# Patient Record
Sex: Male | Born: 1990 | Race: White | Hispanic: No | Marital: Single | Smoking: Current every day smoker
Health system: Southern US, Community
[De-identification: ages and names within clinical notes are randomized; demographics above are authoritative.]

---
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
## 2009-05-10 ENCOUNTER — Emergency Department (HOSPITAL_COMMUNITY): Admission: EM | Admit: 2009-05-10 | Discharge: 2009-05-10 | Payer: Self-pay | Admitting: Emergency Medicine

## 2009-05-19 ENCOUNTER — Emergency Department (HOSPITAL_COMMUNITY): Admission: EM | Admit: 2009-05-19 | Discharge: 2009-05-19 | Payer: Self-pay | Admitting: Emergency Medicine

## 2009-05-24 ENCOUNTER — Emergency Department (HOSPITAL_COMMUNITY): Admission: EM | Admit: 2009-05-24 | Discharge: 2009-05-24 | Payer: Self-pay | Admitting: Emergency Medicine

## 2009-07-04 ENCOUNTER — Emergency Department (HOSPITAL_COMMUNITY): Admission: EM | Admit: 2009-07-04 | Discharge: 2009-07-04 | Payer: Self-pay | Admitting: Emergency Medicine

## 2011-01-22 LAB — GC/CHLAMYDIA PROBE AMP, GENITAL
Chlamydia, DNA Probe: NEGATIVE
GC Probe Amp, Genital: NEGATIVE

## 2011-01-22 LAB — RPR: RPR Ser Ql: NONREACTIVE

## 2011-07-09 ENCOUNTER — Emergency Department (HOSPITAL_COMMUNITY)
Admission: EM | Admit: 2011-07-09 | Discharge: 2011-07-09 | Disposition: A | Discharge status: Home / Self Care | Payer: Self-pay | Attending: Emergency Medicine | Admitting: Emergency Medicine

## 2011-07-09 ENCOUNTER — Encounter: Payer: Self-pay | Admitting: *Deleted

## 2011-07-09 DIAGNOSIS — F17200 Nicotine dependence, unspecified, uncomplicated: Secondary | ICD-10-CM | POA: Insufficient documentation

## 2011-07-09 DIAGNOSIS — Y92009 Unspecified place in unspecified non-institutional (private) residence as the place of occurrence of the external cause: Secondary | ICD-10-CM | POA: Insufficient documentation

## 2011-07-09 DIAGNOSIS — S5010XA Contusion of unspecified forearm, initial encounter: Secondary | ICD-10-CM | POA: Insufficient documentation

## 2011-07-09 DIAGNOSIS — K029 Dental caries, unspecified: Secondary | ICD-10-CM | POA: Insufficient documentation

## 2011-07-09 DIAGNOSIS — T148XXA Other injury of unspecified body region, initial encounter: Secondary | ICD-10-CM

## 2011-07-09 DIAGNOSIS — X58XXXA Exposure to other specified factors, initial encounter: Secondary | ICD-10-CM | POA: Insufficient documentation

## 2011-07-09 NOTE — ED Provider Notes (Signed)
History     CSN: [id number] Arrival date & time: 07/09/2011  9:20 PM  Chief Complaint  Patient presents with  . Insect Bite    HPI  (Consider location/radiation/quality/duration/timing/severity/associated sxs/prior treatment)  HPI Comments: Pt states that while mowing the lawn he felt a sting, then noted a bruise to the left forearm. No bleeding. Not painful. Not affected by movement. Does not affect other parts of the left upper ext. No fever or chills. He has not taken anything for the affected area.  The history is provided by the patient.    History reviewed. No pertinent past medical history.  History reviewed. No pertinent past surgical history.  History reviewed. No pertinent family history.  History  Substance Use Topics  . Smoking status: Current Some Day Smoker  . Smokeless tobacco: Not on file  . Alcohol Use: Yes      Review of Systems  Review of Systems  Constitutional: Negative for activity change.       All ROS Neg except as noted in HPI  HENT: Negative for nosebleeds and neck pain.   Eyes: Negative for photophobia and discharge.  Respiratory: Negative for cough, shortness of breath and wheezing.   Cardiovascular: Negative for chest pain and palpitations.  Gastrointestinal: Negative for abdominal pain and blood in stool.  Genitourinary: Negative for dysuria, frequency and hematuria.  Musculoskeletal: Negative for back pain and arthralgias.  Skin: Negative.   Neurological: Negative for dizziness, seizures and speech difficulty.  Psychiatric/Behavioral: Negative for hallucinations and confusion.    Allergies  Review of patient's allergies indicates no known allergies.  Home Medications  No current outpatient prescriptions on file.  Physical Exam    BP 118/66  Pulse 91  Temp 97.7 F (36.5 C)  Resp 20  Ht 5\' 10"  (1.778 m)  Wt 240 lb (108.863 kg)  BMI 34.44 kg/m2  SpO2 98%  Physical Exam  Nursing note and vitals reviewed. Constitutional:  He is oriented to person, place, and time. He appears well-developed and well-nourished.  Non-toxic appearance.  HENT:  Head: Normocephalic.  Right Ear: Tympanic membrane and external ear normal.  Left Ear: Tympanic membrane and external ear normal.       Dental caries  Eyes: EOM and lids are normal. Pupils are equal, round, and reactive to light.  Neck: Normal range of motion. Neck supple. Carotid bruit is not present.  Cardiovascular: Normal rate, regular rhythm, normal heart sounds, intact distal pulses and normal pulses.   Pulmonary/Chest: Breath sounds normal. No respiratory distress.  Abdominal: Soft. Bowel sounds are normal. There is no tenderness. There is no guarding.  Musculoskeletal: Normal range of motion.       Bruise to the ulnar surface of the right elbow. Not hot. No red streaks. FROM of the right shoulder, elbow, and wrist.  Lymphadenopathy:       Head (right side): No submandibular adenopathy present.       Head (left side): No submandibular adenopathy present.    He has no cervical adenopathy.  Neurological: He is alert and oriented to person, place, and time. He has normal strength. No cranial nerve deficit or sensory deficit.  Skin: Skin is warm and dry.  Psychiatric: He has a normal mood and affect. His speech is normal.    ED Course: advised pt to return if any changes or problem.  Procedures (including critical care time)  Labs Reviewed - No data to display No results found.   1. Bruise to the forearm(right)  MDM  I have reviewed nursing notes, vital signs, and all appropriate lab and imaging results for this patient.        Kathie Dike, [place] 07/09/11 2229

## 2011-07-09 NOTE — ED Notes (Signed)
Pt has a bruise on left forearm.

## 2011-07-11 NOTE — ED Provider Notes (Signed)
Medical screening examination/treatment/procedure(s) were performed by non-physician practitioner and as supervising physician I was immediately available for consultation/collaboration.   Benny Lennert, MD 07/11/11 1436

## 2012-07-29 ENCOUNTER — Emergency Department: Payer: Self-pay | Admitting: Emergency Medicine

## 2012-09-17 ENCOUNTER — Emergency Department (HOSPITAL_COMMUNITY)
Admission: EM | Admit: 2012-09-17 | Discharge: 2012-09-17 | Disposition: A | Discharge status: Home / Self Care | Payer: Self-pay | Attending: Emergency Medicine | Admitting: Emergency Medicine

## 2012-09-17 ENCOUNTER — Encounter (HOSPITAL_COMMUNITY): Payer: Self-pay | Admitting: *Deleted

## 2012-09-17 DIAGNOSIS — F17200 Nicotine dependence, unspecified, uncomplicated: Secondary | ICD-10-CM | POA: Insufficient documentation

## 2012-09-17 DIAGNOSIS — N4889 Other specified disorders of penis: Secondary | ICD-10-CM | POA: Insufficient documentation

## 2012-09-17 MED ORDER — IBUPROFEN 600 MG PO TABS: 600.0000 mg | ORAL_TABLET | Freq: Four times a day (QID) | ORAL | Status: DC | PRN

## 2012-09-17 MED ORDER — IBUPROFEN 800 MG PO TABS
800.0000 mg | ORAL_TABLET | Freq: Once | ORAL | Status: AC
  Administered 2012-09-17: 800 mg via ORAL
  Filled 2012-09-17: qty 1
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
## 2012-09-21 NOTE — ED Provider Notes (Signed)
Medical screening examination/treatment/procedure(s) were performed by non-physician practitioner and as supervising physician I was immediately available for consultation/collaboration.  Urian Martenson, MD 09/21/12 0614 

## 2013-03-21 ENCOUNTER — Emergency Department (HOSPITAL_COMMUNITY)
Admission: EM | Admit: 2013-03-21 | Discharge: 2013-03-21 | Disposition: A | Discharge status: Home / Self Care | Payer: Self-pay | Attending: Emergency Medicine | Admitting: Emergency Medicine

## 2013-03-21 ENCOUNTER — Encounter (HOSPITAL_COMMUNITY): Payer: Self-pay | Admitting: Emergency Medicine

## 2013-03-21 DIAGNOSIS — L0390 Cellulitis, unspecified: Secondary | ICD-10-CM | POA: Insufficient documentation

## 2013-03-21 DIAGNOSIS — L299 Pruritus, unspecified: Secondary | ICD-10-CM | POA: Insufficient documentation

## 2013-03-21 DIAGNOSIS — F17200 Nicotine dependence, unspecified, uncomplicated: Secondary | ICD-10-CM | POA: Insufficient documentation

## 2013-03-21 DIAGNOSIS — L259 Unspecified contact dermatitis, unspecified cause: Secondary | ICD-10-CM

## 2013-03-21 DIAGNOSIS — L0291 Cutaneous abscess, unspecified: Secondary | ICD-10-CM | POA: Insufficient documentation

## 2013-03-21 MED ORDER — DIPHENHYDRAMINE HCL 25 MG PO CAPS
25.0000 mg | ORAL_CAPSULE | Freq: Once | ORAL | Status: AC
  Administered 2013-03-21: 25 mg via ORAL
  Filled 2013-03-21: qty 1

## 2013-03-21 MED ORDER — PREDNISONE 50 MG PO TABS
60.0000 mg | ORAL_TABLET | Freq: Once | ORAL | Status: AC
  Administered 2013-03-21: 60 mg via ORAL
  Filled 2013-03-21: qty 1

## 2013-03-21 MED ORDER — PREDNISONE 10 MG PO TABS: ORAL_TABLET | ORAL | Status: DC

## 2013-03-21 NOTE — ED Notes (Signed)
Pt alert & oriented x4, stable gait. Patient given discharge instructions, paperwork & prescription(s). Patient  instructed to stop at the registration desk to finish any additional paperwork. Patient verbalized understanding. Pt left department w/ no further questions. 

## 2013-03-21 NOTE — ED Notes (Signed)
Pt has rash to the upper chest for over a week. Hurts when he touches it. Denies any new clothes or detergents. Pt also states some pain from hemorrhoids and wanting to know what he can do.,

## 2013-03-21 NOTE — ED Provider Notes (Signed)
History     CSN: [id number]  Arrival date & time 03/21/13  2057   First MD Initiated Contact with Patient 03/21/13 2135      Chief Complaint  Patient presents with  . Rash    (Consider location/radiation/quality/duration/timing/severity/associated sxs/prior treatment) HPI Comments: Thomas Potts is a 22 y.o. male who presents to the Emergency Department complaining of rash to chest, upper arms and upper back for one week.  States the rash itches and seems to be worse with sweating.  He has tried OTC creams w/o relief.  He denies fever, joint pain, tick bite or swelling.    The history is provided by the patient.    History reviewed. No pertinent past medical history.  History reviewed. No pertinent past surgical history.  History reviewed. No pertinent family history.  History  Substance Use Topics  . Smoking status: Current Some Day Smoker  . Smokeless tobacco: Not on file  . Alcohol Use: Yes      Review of Systems  Constitutional: Negative for fever, chills, activity change and appetite change.  HENT: Negative for sore throat, facial swelling, trouble swallowing, neck pain and neck stiffness.   Respiratory: Negative for chest tightness, shortness of breath and wheezing.   Skin: Positive for rash. Negative for wound.  Neurological: Negative for dizziness, weakness, numbness and headaches.  All other systems reviewed and are negative.    Allergies  Other  Home Medications   Current Outpatient Rx  Name  Route  Sig  Dispense  Refill  . ibuprofen (ADVIL,MOTRIN) 600 MG tablet   Oral   Take 1 tablet (600 mg total) by mouth every 6 (six) hours as needed for pain.   30 tablet   0     BP 133/69  Pulse 82  Temp(Src) 97.7 F (36.5 C) (Oral)  Resp 18  Ht 5\' 10"  (1.778 m)  Wt 227 lb (102.967 kg)  BMI 32.57 kg/m2  SpO2 100%  Physical Exam  Nursing note and vitals reviewed. Constitutional: He is oriented to person, place, and time. He appears  well-developed and well-nourished. No distress.  HENT:  Head: Normocephalic and atraumatic.  Mouth/Throat: Uvula is midline, oropharynx is clear and moist and mucous membranes are normal. No oral lesions.  Neck: Normal range of motion. Neck supple.  Cardiovascular: Normal rate, regular rhythm, normal heart sounds and intact distal pulses.   No murmur heard. Pulmonary/Chest: Effort normal and breath sounds normal. No respiratory distress.  Musculoskeletal: He exhibits no edema and no tenderness.  Lymphadenopathy:    He has no cervical adenopathy.  Neurological: He is alert and oriented to person, place, and time. He exhibits normal muscle tone. Coordination normal.  Skin: Rash noted. There is erythema.  Erythematous maculopapular rash to the upper trunk and upper bilateral arms. No edema or pustules    ED Course  Procedures (including critical care time)  Labs Reviewed - No data to display No results found.      MDM    Patient is alert, VSS.  Non-toxic appearing. Maculopapular rash to the trunk that appears c/w contact dermatitis.  Hands and feet are spared.     Will treat with steroid taper, pt agrees to return here if sx's worsen.    The patient appears reasonably screened and/or stabilized for discharge and I doubt any other medical condition or other [facility] requiring further screening, evaluation, or treatment in the ED at this time prior to discharge.      Shuree Brossart L. Khya Halls, PA-C  03/23/13 2035 

## 2013-03-21 NOTE — ED Notes (Signed)
Patient complaining of rash to chest and upper body that started approximately a week ago.

## 2013-03-25 NOTE — ED Provider Notes (Signed)
Medical screening examination/treatment/procedure(s) were performed by non-physician practitioner and as supervising physician I was immediately available for consultation/collaboration.   Adra Shepler M Jarely Juncaj, DO 03/25/13 1225 

## 2013-04-30 ENCOUNTER — Emergency Department (HOSPITAL_COMMUNITY)
Admission: EM | Admit: 2013-04-30 | Discharge: 2013-04-30 | Discharge status: Against Medical Advice | Payer: Self-pay | Attending: Emergency Medicine | Admitting: Emergency Medicine

## 2013-04-30 ENCOUNTER — Encounter (HOSPITAL_COMMUNITY): Payer: Self-pay

## 2013-04-30 DIAGNOSIS — Y939 Activity, unspecified: Secondary | ICD-10-CM | POA: Insufficient documentation

## 2013-04-30 DIAGNOSIS — W57XXXA Bitten or stung by nonvenomous insect and other nonvenomous arthropods, initial encounter: Secondary | ICD-10-CM | POA: Insufficient documentation

## 2013-04-30 DIAGNOSIS — S90569A Insect bite (nonvenomous), unspecified ankle, initial encounter: Secondary | ICD-10-CM | POA: Insufficient documentation

## 2013-04-30 DIAGNOSIS — Y929 Unspecified place or not applicable: Secondary | ICD-10-CM | POA: Insufficient documentation

## 2013-04-30 NOTE — ED Notes (Signed)
After triage, pt asked how long before he would be seen. When informed pt that he may have to wait  A bit, pt ripped off his armband and states "i'm leaving" and left e.d.

## 2013-04-30 NOTE — ED Notes (Signed)
Pt has ?? Insect bite to right upper thigh, states he does not know what bit him, is burning pain

## 2013-05-25 ENCOUNTER — Encounter (HOSPITAL_COMMUNITY): Payer: Self-pay

## 2013-05-25 ENCOUNTER — Emergency Department (HOSPITAL_COMMUNITY)
Admission: EM | Admit: 2013-05-25 | Discharge: 2013-05-25 | Disposition: A | Discharge status: Home / Self Care | Payer: Self-pay | Attending: Emergency Medicine | Admitting: Emergency Medicine

## 2013-05-25 DIAGNOSIS — J029 Acute pharyngitis, unspecified: Secondary | ICD-10-CM | POA: Insufficient documentation

## 2013-05-25 DIAGNOSIS — F17200 Nicotine dependence, unspecified, uncomplicated: Secondary | ICD-10-CM | POA: Insufficient documentation

## 2013-05-25 LAB — RAPID STREP SCREEN (MED CTR MEBANE ONLY): Streptococcus, Group A Screen (Direct): NEGATIVE

## 2013-05-25 MED ORDER — NAPROXEN 500 MG PO TABS: 500.0000 mg | ORAL_TABLET | Freq: Two times a day (BID) | ORAL | Status: DC

## 2013-05-25 NOTE — ED Notes (Signed)
Sore throat for 3 days per pt.

## 2013-05-25 NOTE — ED Provider Notes (Signed)
CSN: [id number]     Arrival date & time 05/25/13  0135 History     First MD Initiated Contact with Patient 05/25/13 [phone]     Chief Complaint  Patient presents with  . Sore Throat   (Consider location/radiation/quality/duration/timing/severity/associated sxs/prior Treatment) HPI Comments: 22 year old male with no significant past medical history who presents with a sore throat which he states has been present for approximately 3 days and started after he ate some peach cobbler. He states that it did not taste quite right, he thinks it may have been too old and noticed that several hours after taking this food he developed a sore throat. This is intermittent, seems to be worse when he swallows, is not constant and is not associated with fevers chills nausea vomiting coughing shortness of breath swelling of the neck stiffness of the neck back pain or headache. He has no pain in his face, no toothache, no nasal congestion.  Patient is a 22 y.o. male presenting with pharyngitis. The history is provided by the patient and a relative.  Sore Throat Pertinent negatives include no abdominal pain.    History reviewed. No pertinent past medical history. History reviewed. No pertinent past surgical history. No family history on file. History  Substance Use Topics  . Smoking status: Current Some Day Smoker  . Smokeless tobacco: Not on file  . Alcohol Use: Yes    Review of Systems  Constitutional: Negative for fever, chills and fatigue.  HENT: Negative for neck pain and neck stiffness.   Gastrointestinal: Negative for abdominal pain.    Allergies  Other  Home Medications   Current Outpatient Rx  Name  Route  Sig  Dispense  Refill  . ibuprofen (ADVIL,MOTRIN) 600 MG tablet   Oral   Take 1 tablet (600 mg total) by mouth every 6 (six) hours as needed for pain.   30 tablet   0   . naproxen (NAPROSYN) 500 MG tablet   Oral   Take 1 tablet (500 mg total) by mouth 2 (two) times daily with a  meal.   30 tablet   0   . predniSONE (DELTASONE) 10 MG tablet      Take 6 tablets day one, 5 tablets day two, 4 tablets day three, 3 tablets day four, 2 tablets day five, then 1 tablet day six   21 tablet   0    BP 119/69  Pulse 70  Temp(Src) 98.4 F (36.9 C) (Oral)  Resp 18  Ht 5\' 10"  (1.778 m)  Wt 223 lb (101.152 kg)  BMI 32 kg/m2  SpO2 100% Physical Exam  Nursing note and vitals reviewed. Constitutional: He appears well-developed and well-nourished. No distress.  HENT:  Head: Normocephalic and atraumatic.  Mouth/Throat: Oropharynx is clear and moist. No oropharyngeal exudate.  No erythema, exudate, asymmetry, hypertrophy. Mucous membranes are moist, phonation is normal, dentition is normal, tympanic membranes are normal, nasal passages are clear  Eyes: Conjunctivae and EOM are normal. Pupils are equal, round, and reactive to light. Right eye exhibits no discharge. Left eye exhibits no discharge. No scleral icterus.  Neck: Normal range of motion. Neck supple. No JVD present. No thyromegaly present.  Cardiovascular: Normal rate, regular rhythm, normal heart sounds and intact distal pulses.  Exam reveals no gallop and no friction rub.   No murmur heard. Pulmonary/Chest: Effort normal and breath sounds normal. No respiratory distress. He has no wheezes. He has no rales.  Abdominal: Soft. Bowel sounds are normal. He exhibits no distension  and no mass. There is no tenderness.  No hepatosplenomegaly  Musculoskeletal: Normal range of motion. He exhibits no edema and no tenderness.  Lymphadenopathy:    He has no cervical adenopathy.  Neurological: He is alert. Coordination normal.  Skin: Skin is warm and dry. No rash noted. No erythema.  Psychiatric: He has a normal mood and affect. His behavior is normal.    ED Course   Procedures (including critical care time)  Labs Reviewed  RAPID STREP SCREEN  CULTURE, GROUP A STREP   No results found. 1. Pharyngitis     MDM  The  patient has a normal exam, he has no fever, no tachycardia and no evidence of strep throat on his exam. Rapid strep test performed, ordered by nursing, is negative  The patient may have incurred a small abrasion to his pharynx however at this time he has no evidence for a retropharyngeal abscess, peritonsillar abscess or significant infection of his throat. He is very well appearing, has normal phonation, very supple neck with no decreased range of motion and appears very stable for discharge. We'll place him on anti-inflammatories and have followup with family doctor.  Vida Roller, MD 05/25/13 [phone]

## 2013-05-27 LAB — CULTURE, GROUP A STREP

## 2013-05-28 ENCOUNTER — Emergency Department (HOSPITAL_COMMUNITY)
Admission: EM | Admit: 2013-05-28 | Discharge: 2013-05-28 | Disposition: A | Discharge status: Home / Self Care | Payer: Self-pay | Attending: Emergency Medicine | Admitting: Emergency Medicine

## 2013-05-28 ENCOUNTER — Emergency Department (HOSPITAL_COMMUNITY): Payer: Self-pay

## 2013-05-28 ENCOUNTER — Encounter (HOSPITAL_COMMUNITY): Payer: Self-pay | Admitting: *Deleted

## 2013-05-28 DIAGNOSIS — R197 Diarrhea, unspecified: Secondary | ICD-10-CM | POA: Insufficient documentation

## 2013-05-28 DIAGNOSIS — F411 Generalized anxiety disorder: Secondary | ICD-10-CM | POA: Insufficient documentation

## 2013-05-28 DIAGNOSIS — J029 Acute pharyngitis, unspecified: Secondary | ICD-10-CM | POA: Insufficient documentation

## 2013-05-28 DIAGNOSIS — E069 Thyroiditis, unspecified: Secondary | ICD-10-CM | POA: Insufficient documentation

## 2013-05-28 DIAGNOSIS — F17200 Nicotine dependence, unspecified, uncomplicated: Secondary | ICD-10-CM | POA: Insufficient documentation

## 2013-05-28 DIAGNOSIS — Z880 Allergy status to penicillin: Secondary | ICD-10-CM | POA: Insufficient documentation

## 2013-05-28 DIAGNOSIS — R1310 Dysphagia, unspecified: Secondary | ICD-10-CM | POA: Insufficient documentation

## 2013-05-28 NOTE — ED Provider Notes (Signed)
CSN: [id number]     Arrival date & time 05/28/13  1952 History  This chart was scribed for Ward Givens, MD by Ardelia Mems, ED Scribe. This patient was seen in room APA03/[facility]03 and the patient's care was started at 8:21 PM.    Chief Complaint  Patient presents with  . Sore Throat  . Cough    The history is provided by the patient. No language interpreter was used.    HPI Comments: Thomas Potts is a 22 y.o. Male without significant PMH who presents to the Emergency Department complaining of a sore throat, with pain around the larynx, over the past week. He states that he also has pain with swallowing and sometimes with breathing. He states that he has had a productive cough of white sputum for two days in the past week  but only in the mornings. He states that he had 1 episode of diarrhea 2 days ago a week ago, but he has not had diarrhea since. He was seen in the ED 3 days ago, and had a negative strep test.  He was given a prescription that he did not fill. He denies fever, chills, congestion, rhinorrhea, nausea, vomiting, rash or any other symptoms. Pt is a current every day smoker of about 0.5 packs/day and an occasional alcohol user. He states that he has been working at a [employer] for about 4 months. He states they come a blow dust off the equipment and sprinkle with water but he doesn't feel worse at work.   PCP- has a family doctor but cannot recall his name   History reviewed. No pertinent past medical history.  History reviewed. No pertinent past surgical history.  History reviewed. No pertinent family history.  History  Substance Use Topics  . Smoking status: Current Some Day Smoker    Types: Cigarettes  . Smokeless tobacco: Not on file  . Alcohol Use: Yes     Comment: occ .use  employed  Review of Systems  Constitutional: Negative for fever and chills.  HENT: Positive for sore throat (pain around the larynx) and trouble swallowing (pain with swallowing).  Negative for congestion and rhinorrhea.   Respiratory: Positive for cough.   Gastrointestinal: Positive for diarrhea (subsided). Negative for nausea and vomiting.  Skin: Negative for rash.  All other systems reviewed and are negative.   Allergies  Penicillins  Home Medications   none Triage Vitals: BP 113/59  Pulse 70  Temp(Src) 98.2 F (36.8 C) (Oral)  Resp 18  Ht 5\' 10"  (1.778 m)  Wt 223 lb (101.152 kg)  BMI 32 kg/m2  SpO2 100%  Vital signs normal    Physical Exam  Nursing note and vitals reviewed. Constitutional: He is oriented to person, place, and time. He appears well-developed and well-nourished.  Non-toxic appearance. He does not appear ill. No distress.  HENT:  Head: Normocephalic and atraumatic.  Right Ear: External ear normal.  Left Ear: External ear normal.  Nose: Nose normal. No mucosal edema or rhinorrhea.  Mouth/Throat: Oropharynx is clear and moist and mucous membranes are normal. No dental abscesses or edematous.  Thyroid does not feel enlarged, but he feels tenderness around his thyroid when he swallow.  Eyes: Conjunctivae and EOM are normal. Pupils are equal, round, and reactive to light.  Neck: Normal range of motion and full passive range of motion without pain. Neck supple.  Cardiovascular: Normal rate, regular rhythm and normal heart sounds.  Exam reveals no gallop and no friction rub.  No murmur heard. Pulmonary/Chest: Effort normal and breath sounds normal. No respiratory distress. He has no wheezes. He has no rhonchi. He has no rales. He exhibits no tenderness and no crepitus.  Abdominal: Soft. Normal appearance and bowel sounds are normal. He exhibits no distension. There is no tenderness. There is no rebound and no guarding.  Musculoskeletal: Normal range of motion. He exhibits no edema and no tenderness.  Moves all extremities well.   Neurological: He is alert and oriented to person, place, and time. He has normal strength. No cranial nerve  deficit.  Skin: Skin is warm, dry and intact. No rash noted. No erythema. No pallor.  Psychiatric: His speech is normal and behavior is normal. His mood appears anxious.    ED Course   Procedures (including critical care time)  DIAGNOSTIC STUDIES: Oxygen Saturation is 100% on RA, normal by my interpretation.    COORDINATION OF CARE: 9:03 PM- Pt advised of normal radiology findings.  Results for orders placed during the hospital encounter of 05/25/13  RAPID STREP SCREEN      Result Value Range   Streptococcus, Group A Screen (Direct) NEGATIVE  NEGATIVE  CULTURE, GROUP A STREP      Result Value Range   Specimen Description THROAT     Special Requests NONE     Culture       Value: No Beta Hemolytic Streptococci Isolated     Performed at [facility]   Report Status 05/27/2013 FINAL    Thyroid tests pending  Dg Chest 2 View  05/28/2013   *RADIOLOGY REPORT*  Clinical Data: Shortness of breath  CHEST - 2 VIEW  Comparison: 05/10/2009  Findings: Cardiomediastinal silhouette is within normal limits. The lungs are clear. No pleural effusion.  No pneumothorax.  No acute osseous abnormality.  IMPRESSION: Normal chest.   Original Report Authenticated By: Christiana Pellant, M.D.   1. Thyroiditis     Plan discharge   Devoria Albe, MD, FACEP   MDM    I personally performed the services described in this documentation, which was scribed in my presence. The recorded information has been reviewed and considered.  Devoria Albe, MD, Armando Gang     Ward Givens, MD 05/28/13 2136

## 2013-05-28 NOTE — ED Notes (Signed)
C/o sore throat and cough, productive at times, denies fever and N/V

## 2013-05-28 NOTE — ED Notes (Signed)
MD at bedside. 

## 2013-05-29 LAB — T4, FREE: Free T4: 1.05 ng/dL (ref 0.80–1.80)

## 2013-05-29 LAB — TSH: TSH: 3.184 u[IU]/mL (ref 0.350–4.500)

## 2013-06-24 ENCOUNTER — Emergency Department (HOSPITAL_COMMUNITY)
Admission: EM | Admit: 2013-06-24 | Discharge: 2013-06-25 | Disposition: A | Discharge status: Home / Self Care | Payer: Self-pay | Attending: Emergency Medicine | Admitting: Emergency Medicine

## 2013-06-24 ENCOUNTER — Encounter (HOSPITAL_COMMUNITY): Payer: Self-pay | Admitting: *Deleted

## 2013-06-24 DIAGNOSIS — F17200 Nicotine dependence, unspecified, uncomplicated: Secondary | ICD-10-CM | POA: Insufficient documentation

## 2013-06-24 DIAGNOSIS — J029 Acute pharyngitis, unspecified: Secondary | ICD-10-CM | POA: Insufficient documentation

## 2013-06-24 DIAGNOSIS — R509 Fever, unspecified: Secondary | ICD-10-CM | POA: Insufficient documentation

## 2013-06-24 DIAGNOSIS — Z880 Allergy status to penicillin: Secondary | ICD-10-CM | POA: Insufficient documentation

## 2013-06-24 DIAGNOSIS — R059 Cough, unspecified: Secondary | ICD-10-CM | POA: Insufficient documentation

## 2013-06-24 DIAGNOSIS — R630 Anorexia: Secondary | ICD-10-CM | POA: Insufficient documentation

## 2013-06-24 DIAGNOSIS — B349 Viral infection, unspecified: Secondary | ICD-10-CM

## 2013-06-24 DIAGNOSIS — R05 Cough: Secondary | ICD-10-CM | POA: Insufficient documentation

## 2013-06-24 DIAGNOSIS — B9789 Other viral agents as the cause of diseases classified elsewhere: Secondary | ICD-10-CM | POA: Insufficient documentation

## 2013-06-24 LAB — RAPID STREP SCREEN (MED CTR MEBANE ONLY): Streptococcus, Group A Screen (Direct): NEGATIVE

## 2013-06-24 NOTE — ED Notes (Signed)
Pt states he woke today & was so weak he was unable to go to work. Pt denies any fever, has not eaten much today.

## 2013-06-24 NOTE — ED Notes (Signed)
Pt c/o throat "irritation". Denies any pain with swallowing.

## 2013-06-24 NOTE — ED Notes (Signed)
Pt reports he was "feeling weak today. I couldn't go into work. They told me I had to have a doctor's note, and I just wanted to make sure it wasn't contagious to spread around work." Denies N/V, abdominal pain, cough, sore throat.

## 2013-06-24 NOTE — ED Provider Notes (Signed)
Scribed for Donnetta Hutching, MD, the patient was seen in room APA05/APA05. This chart was scribed by Lewanda Rife, ED scribe. Patient's care was started at 2316  CSN: [id number]     Arrival date & time 06/24/13  2242 History   First MD Initiated Contact with Patient 06/24/13 2305     Chief Complaint  Patient presents with  . Fatigue   (Consider location/radiation/quality/duration/timing/severity/associated sxs/prior Treatment) The history is provided by the patient.   HPI Comments: Thomas Potts is a 22 y.o. male who presents to the Emergency Department complaining of constant mild fatigue onset this morning. Reports associated mild sore throat and decreased appetite. Denies associated cough, and fever. 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 Denies any aggravating or alleviating factors. Denies taking any medications PTA to relieve symptoms. Reports he smokes 1 pack every 3 days and drinks alcohol. Reports 1 possible sick contact.   History reviewed. No pertinent past medical history. History reviewed. No pertinent past surgical history. No family history on file. History  Substance Use Topics  . Smoking status: Current Some Day Smoker    Types: Cigarettes  . Smokeless tobacco: Not on file  . Alcohol Use: Yes     Comment: occ .use    Review of Systems  Constitutional: Negative for fever.  HENT: Positive for sore throat.   Respiratory: Negative for cough.   Psychiatric/Behavioral: Negative for confusion.   A complete 10 system review of systems was obtained and all systems are negative except as noted in the HPI and PMH.    Allergies  Penicillins  Home Medications   Current Outpatient Rx  Name  Route  Sig  Dispense  Refill  . ibuprofen (ADVIL,MOTRIN) 600 MG tablet   Oral   Take 1 tablet (600 mg total) by mouth every 6 (six) hours as needed for pain.   30 tablet   0   . naproxen (NAPROSYN) 500 MG tablet   Oral   Take 1 tablet (500 mg total) by mouth 2 (two) times daily  with a meal.   30 tablet   0   . predniSONE (DELTASONE) 10 MG tablet      Take 6 tablets day one, 5 tablets day two, 4 tablets day three, 3 tablets day four, 2 tablets day five, then 1 tablet day six   21 tablet   0    BP 123/58  Pulse 69  Temp(Src) 97.3 F (36.3 C) (Oral)  Resp 20  Ht 5\' 11"  (1.803 m)  Wt 221 lb (100.245 kg)  BMI 30.84 kg/m2  SpO2 100% Physical Exam  Nursing note and vitals reviewed. Constitutional: He is oriented to person, place, and time. He appears well-developed and well-nourished. No distress.  HENT:  Head: Normocephalic and atraumatic.  Mouth/Throat: Uvula is midline and mucous membranes are normal. Posterior oropharyngeal erythema (mild) present. No oropharyngeal exudate or posterior oropharyngeal edema.  Eyes: Conjunctivae and EOM are normal.  Neck: Neck supple. No tracheal deviation present.  Cardiovascular: Normal rate and regular rhythm.   Pulmonary/Chest: Effort normal and breath sounds normal. No respiratory distress.  Musculoskeletal: Normal range of motion.  Neurological: He is alert and oriented to person, place, and time.  Skin: Skin is warm and dry.  Psychiatric: He has a normal mood and affect. His behavior is normal.    ED Course  Procedures (including critical care time) Medications - No data to display  Labs Review Labs Reviewed  RAPID STREP SCREEN  CULTURE, GROUP A STREP   Imaging  Review No results found.  MDM  No diagnosis found. Strep test negative. Patient is nontoxic. Suspect viral syndrome I personally performed the services described in this documentation, which was scribed in my presence. The recorded information has been reviewed and is accurate.    Donnetta Hutching, MD 06/25/13 [phone]

## 2013-06-27 LAB — CULTURE, GROUP A STREP

## 2014-01-02 ENCOUNTER — Emergency Department (HOSPITAL_COMMUNITY)
Admission: EM | Admit: 2014-01-02 | Discharge: 2014-01-02 | Disposition: A | Discharge status: Home / Self Care | Payer: Self-pay | Attending: Emergency Medicine | Admitting: Emergency Medicine

## 2014-01-02 ENCOUNTER — Encounter (HOSPITAL_COMMUNITY): Payer: Self-pay | Admitting: Emergency Medicine

## 2014-01-02 DIAGNOSIS — R0602 Shortness of breath: Secondary | ICD-10-CM | POA: Insufficient documentation

## 2014-01-02 DIAGNOSIS — R059 Cough, unspecified: Secondary | ICD-10-CM | POA: Insufficient documentation

## 2014-01-02 DIAGNOSIS — F17200 Nicotine dependence, unspecified, uncomplicated: Secondary | ICD-10-CM | POA: Insufficient documentation

## 2014-01-02 DIAGNOSIS — Z880 Allergy status to penicillin: Secondary | ICD-10-CM | POA: Insufficient documentation

## 2014-01-02 DIAGNOSIS — R05 Cough: Secondary | ICD-10-CM

## 2014-01-02 LAB — CBC WITH DIFFERENTIAL/PLATELET
Basophils Absolute: 0 10*3/uL (ref 0.0–0.1)
Basophils Relative: 0 % (ref 0–1)
Eosinophils Absolute: 0.1 10*3/uL (ref 0.0–0.7)
Eosinophils Relative: 1 % (ref 0–5)
HCT: 45.1 % (ref 39.0–52.0)
Hemoglobin: 15.6 g/dL (ref 13.0–17.0)
Lymphocytes Relative: 18 % (ref 12–46)
Lymphs Abs: 1.9 10*3/uL (ref 0.7–4.0)
MCH: 31.4 pg (ref 26.0–34.0)
MCHC: 34.6 g/dL (ref 30.0–36.0)
MCV: 90.7 fL (ref 78.0–100.0)
Monocytes Absolute: 1.1 10*3/uL — ABNORMAL HIGH (ref 0.1–1.0)
Monocytes Relative: 11 % (ref 3–12)
Neutro Abs: 7.5 10*3/uL (ref 1.7–7.7)
Neutrophils Relative %: 71 % (ref 43–77)
Platelets: 166 10*3/uL (ref 150–400)
RBC: 4.97 MIL/uL (ref 4.22–5.81)
RDW: 11.9 % (ref 11.5–15.5)
WBC: 10.6 10*3/uL — ABNORMAL HIGH (ref 4.0–10.5)

## 2014-01-02 LAB — D-DIMER, QUANTITATIVE: D-Dimer, Quant: <0.27 ug/mL-FEU (ref 0.00–0.48)

## 2014-01-02 MED ORDER — GUAIFENESIN-CODEINE 100-10 MG/5ML PO SYRP
10.0000 mL | ORAL_SOLUTION | Freq: Three times a day (TID) | ORAL | Status: DC | PRN
Start: 2014-01-02 — End: 2015-12-17

## 2014-01-02 MED ORDER — ALBUTEROL SULFATE HFA 108 (90 BASE) MCG/ACT IN AERS
2.0000 | INHALATION_SPRAY | Freq: Once | RESPIRATORY_TRACT | Status: AC
  Administered 2014-01-02: 2 via RESPIRATORY_TRACT
  Filled 2014-01-02: qty 6.7

## 2014-01-02 NOTE — ED Notes (Signed)
Pt awaiting RT.

## 2014-01-02 NOTE — ED Provider Notes (Signed)
CSN: [id number]     Arrival date & time 01/02/14  1627 History   First MD Initiated Contact with Patient 01/02/14 1735     Chief Complaint  Patient presents with  . Cough     (Consider location/radiation/quality/duration/timing/severity/associated sxs/prior Treatment) HPI Comments: Thomas Potts is a 23 y.o. male who presents to the Emergency Department complaining of nonproductive cough with intermittent shortness of breath for 2 days. He states the shortness of breath is mainly with exertion. He denies chest pain, fever, chills, myalgias, sore throat or nasal congestion.  He also denies any recent sick contacts. He admits to smoking cigarettes but less than one pack per day.  Patient is a 23 y.o. male presenting with cough. The history is provided by the patient.  Cough Associated symptoms: shortness of breath   Associated symptoms: no chills, no fever, no headaches, no myalgias, no rash, no rhinorrhea, no sore throat and no wheezing     History reviewed. No pertinent past medical history. History reviewed. No pertinent past surgical history. History reviewed. No pertinent family history. History  Substance Use Topics  . Smoking status: Current Some Day Smoker    Types: Cigarettes  . Smokeless tobacco: Not on file  . Alcohol Use: Yes     Comment: occ .use    Review of Systems  Constitutional: Negative for fever, chills, activity change and appetite change.  HENT: Negative for congestion, facial swelling, rhinorrhea, sore throat and trouble swallowing.   Eyes: Negative for visual disturbance.  Respiratory: Positive for cough and shortness of breath. Negative for chest tightness, wheezing and stridor.   Gastrointestinal: Negative for nausea, vomiting and anal bleeding.  Musculoskeletal: Negative for back pain, myalgias, neck pain and neck stiffness.  Skin: Negative.  Negative for rash.  Neurological: Negative for dizziness, weakness, numbness and headaches.   Hematological: Negative for adenopathy.  Psychiatric/Behavioral: Negative for confusion.  All other systems reviewed and are negative.      Allergies  Penicillins  Home Medications  No current outpatient prescriptions on file. BP 127/64  Pulse 100  Temp(Src) 97.9 F (36.6 C) (Oral)  Resp 18  Ht 5\' 11"  (1.803 m)  Wt 240 lb (108.863 kg)  BMI 33.49 kg/m2  SpO2 100% Physical Exam  Nursing note and vitals reviewed. Constitutional: He is oriented to person, place, and time. He appears well-developed and well-nourished. No distress.  HENT:  Head: Normocephalic and atraumatic.  Mouth/Throat: Uvula is midline and mucous membranes are normal. Posterior oropharyngeal erythema present. No oropharyngeal exudate, posterior oropharyngeal edema or tonsillar abscesses.  Neck: Normal range of motion. Neck supple.  Cardiovascular: Normal rate, regular rhythm, normal heart sounds and intact distal pulses.   No murmur heard. Pulmonary/Chest: Effort normal and breath sounds normal. No respiratory distress. He has no wheezes. He has no rales. He exhibits no tenderness.  Abdominal: Soft. He exhibits no distension. There is no tenderness.  Musculoskeletal: Normal range of motion. He exhibits no tenderness.  Lymphadenopathy:    He has no cervical adenopathy.  Neurological: He is alert and oriented to person, place, and time. He exhibits normal muscle tone. Coordination normal.  Skin: Skin is warm and dry.    ED Course  Procedures (including critical care time) Labs Review Labs Reviewed  CBC WITH DIFFERENTIAL - Abnormal; Notable for the following:    WBC 10.6 (*)    Monocytes Absolute 1.1 (*)    All other components within normal limits  D-DIMER, QUANTITATIVE   Imaging Review No results found.  EKG Interpretation None      MDM   Final diagnoses:  Cough    Patient is well-appearing. Vital signs are stable. No history of previous DVT/PE.  Lab results reviewed and discussed.   PERC negative, D- dimer also negative.  Sx's likely related to viral illness.  Albuterol inhaler dispensed, robitussin AC for cough.  Pt agrees to return here if needed since no PCP.    VSS.  He is well appearing and stable for d/c.      Danthony Kendrix L. Ogle Hoeffner, PA-C 01/04/14 1316

## 2014-01-02 NOTE — ED Notes (Signed)
Cough, no fever. Feels sob at times.

## 2014-01-02 NOTE — Discharge Instructions (Signed)
Cough, Adult  A cough is a reflex. It helps you clear your throat and airways. A cough can help heal your body. A cough can last 2 or 3 weeks (acute) or may last more than 8 weeks (chronic). Some common causes of a cough can include an infection, allergy, or a cold. HOME CARE  Only take medicine as told by your doctor.  If given, take your medicines (antibiotics) as told. Finish them even if you start to feel better.  Use a cold steam vaporizer or humidier in your home. This can help loosen thick spit (secretions).  Sleep so you are almost sitting up (semi-upright). Use pillows to do this. This helps reduce coughing.  Rest as needed.  Stop smoking if you smoke. GET HELP RIGHT AWAY IF:  You have yellowish-white fluid (pus) in your thick spit.  Your cough gets worse.  Your medicine does not reduce coughing, and you are losing sleep.  You cough up blood.  You have trouble breathing.  Your pain gets worse and medicine does not help.  You have a fever. MAKE SURE YOU:   Understand these instructions.  Will watch your condition.  Will get help right away if you are not doing well or get worse. Document Released: 06/16/2011 Document Revised: 12/26/2011 Document Reviewed: 06/16/2011 Exit[employer] Patient Information 2014 [employer].  

## 2014-01-06 NOTE — ED Provider Notes (Signed)
Medical screening examination/treatment/procedure(s) were performed by non-physician practitioner and as supervising physician I was immediately available for consultation/collaboration.   EKG Interpretation None        Cirilo Canner, MD 01/06/14 0739 

## 2014-06-12 ENCOUNTER — Emergency Department (HOSPITAL_COMMUNITY)
Admission: EM | Admit: 2014-06-12 | Discharge: 2014-06-12 | Disposition: A | Discharge status: Home / Self Care | Payer: No Typology Code available for payment source | Attending: Emergency Medicine | Admitting: Emergency Medicine

## 2014-06-12 ENCOUNTER — Encounter (HOSPITAL_COMMUNITY): Payer: Self-pay | Admitting: Emergency Medicine

## 2014-06-12 DIAGNOSIS — S6990XA Unspecified injury of unspecified wrist, hand and finger(s), initial encounter: Secondary | ICD-10-CM

## 2014-06-12 DIAGNOSIS — S59919A Unspecified injury of unspecified forearm, initial encounter: Secondary | ICD-10-CM

## 2014-06-12 DIAGNOSIS — Y9389 Activity, other specified: Secondary | ICD-10-CM | POA: Insufficient documentation

## 2014-06-12 DIAGNOSIS — S335XXA Sprain of ligaments of lumbar spine, initial encounter: Secondary | ICD-10-CM

## 2014-06-12 DIAGNOSIS — S5010XA Contusion of unspecified forearm, initial encounter: Secondary | ICD-10-CM | POA: Insufficient documentation

## 2014-06-12 DIAGNOSIS — S59909A Unspecified injury of unspecified elbow, initial encounter: Secondary | ICD-10-CM | POA: Diagnosis present

## 2014-06-12 DIAGNOSIS — Z880 Allergy status to penicillin: Secondary | ICD-10-CM | POA: Insufficient documentation

## 2014-06-12 DIAGNOSIS — Y92410 Unspecified street and highway as the place of occurrence of the external cause: Secondary | ICD-10-CM | POA: Diagnosis not present

## 2014-06-12 DIAGNOSIS — F17200 Nicotine dependence, unspecified, uncomplicated: Secondary | ICD-10-CM | POA: Insufficient documentation

## 2014-06-12 DIAGNOSIS — S5011XA Contusion of right forearm, initial encounter: Secondary | ICD-10-CM

## 2014-06-12 MED ORDER — CYCLOBENZAPRINE HCL 10 MG PO TABS: 10.0000 mg | ORAL_TABLET | Freq: Three times a day (TID) | ORAL | Status: DC | PRN

## 2014-06-12 MED ORDER — NAPROXEN 500 MG PO TABS: 500.0000 mg | ORAL_TABLET | Freq: Two times a day (BID) | ORAL | Status: DC
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
## 2014-06-16 NOTE — ED Provider Notes (Signed)
Medical screening examination/treatment/procedure(s) were performed by non-physician practitioner and as supervising physician I was immediately available for consultation/collaboration.   EKG Interpretation None        Iara Monds N Iwalani Templeton, DO 06/16/14 1540 

## 2015-01-01 ENCOUNTER — Emergency Department (HOSPITAL_COMMUNITY)
Admission: EM | Admit: 2015-01-01 | Discharge: 2015-01-01 | Disposition: A | Discharge status: Home / Self Care | Payer: Self-pay | Attending: Emergency Medicine | Admitting: Emergency Medicine

## 2015-01-01 ENCOUNTER — Emergency Department (HOSPITAL_COMMUNITY): Payer: Self-pay

## 2015-01-01 ENCOUNTER — Encounter (HOSPITAL_COMMUNITY): Payer: Self-pay | Admitting: *Deleted

## 2015-01-01 DIAGNOSIS — Y9389 Activity, other specified: Secondary | ICD-10-CM | POA: Insufficient documentation

## 2015-01-01 DIAGNOSIS — Z23 Encounter for immunization: Secondary | ICD-10-CM | POA: Insufficient documentation

## 2015-01-01 DIAGNOSIS — W540XXA Bitten by dog, initial encounter: Secondary | ICD-10-CM | POA: Insufficient documentation

## 2015-01-01 DIAGNOSIS — S61552A Open bite of left wrist, initial encounter: Secondary | ICD-10-CM | POA: Insufficient documentation

## 2015-01-01 DIAGNOSIS — Z720 Tobacco use: Secondary | ICD-10-CM | POA: Insufficient documentation

## 2015-01-01 DIAGNOSIS — S60411A Abrasion of left index finger, initial encounter: Secondary | ICD-10-CM | POA: Insufficient documentation

## 2015-01-01 DIAGNOSIS — Y9289 Other specified places as the place of occurrence of the external cause: Secondary | ICD-10-CM | POA: Insufficient documentation

## 2015-01-01 DIAGNOSIS — Y998 Other external cause status: Secondary | ICD-10-CM | POA: Insufficient documentation

## 2015-01-01 DIAGNOSIS — Z880 Allergy status to penicillin: Secondary | ICD-10-CM | POA: Insufficient documentation

## 2015-01-01 MED ORDER — TETANUS-DIPHTH-ACELL PERTUSSIS 5-2.5-18.5 LF-MCG/0.5 IM SUSP
0.5000 mL | Freq: Once | INTRAMUSCULAR | Status: AC
  Administered 2015-01-01: 0.5 mL via INTRAMUSCULAR
  Filled 2015-01-01: qty 0.5

## 2015-01-01 MED ORDER — DOXYCYCLINE HYCLATE 100 MG PO CAPS: 100.0000 mg | ORAL_CAPSULE | Freq: Two times a day (BID) | ORAL | Status: DC

## 2015-01-01 NOTE — ED Notes (Signed)
Pt was bitten by his girlrfiend's mom's dog just PTA. Pt is unsure if dogs shots are UTD and Animal Control has not been notified. Two puncture wounds to left wrist and an abrasion to left index finger.

## 2015-01-01 NOTE — ED Notes (Signed)
Patient came to registration window and stated he did not think the dog was up to date on his shots. States that it happened on [address] [place], but does not want to give address due to him having to stay at residence at this time. Called [employer] and gave details about dog bite. [employer] Dept states someone will call back.

## 2015-01-01 NOTE — ED Notes (Signed)
Puncture wounds to lt wrist, and small scrape to lt index finger, cleansed,  Pt was breaking up a dog fight.between his dog and another dog.

## 2015-01-01 NOTE — ED Notes (Signed)
[employer] speaking with patient. Patient refuses to give address of dog bite. States "I've already talked to the people I live with and they said the dog has not had his shots and if I give you the address and you have to quarantine the dog, they will kick me out of the house." Patient verbalizes understanding of complications related to rabies but still refuses to give address to PD. Patient agrees to stay in ED for treatment of wounds.

## 2015-01-01 NOTE — ED Provider Notes (Signed)
CSN: [id number]     Arrival date & time 01/01/15  1428 History   First MD Initiated Contact with Patient 01/01/15 1545     Chief Complaint  Patient presents with  . Animal Bite     (Consider location/radiation/quality/duration/timing/severity/associated sxs/prior Treatment) Patient is a 24 y.o. male presenting with animal bite. The history is provided by the patient.  Animal Bite Contact animal:  Dog Location:  Hand Hand injury location:  L wrist Pain details:    Quality:  Aching   Severity:  Mild Incident location:  Home Notifications:  Law enforcement and animal control Animal's rabies vaccination status:  Out of date Animal in possession: yes   Tetanus status:  Out of date Relieved by:  None tried Worsened by:  Nothing tried Ineffective treatments:  None tried  Thomas Potts is a 24 y.o. male who presents to the ED for a dog bite. The bite is to the left wrist and abrasion to the left index finger.  [employer] here to file report.  Patient reports that the dog that bit him was a mix breed, part shepherd. His dog and the dog that bit him were fighting and he reached in to break up the fight and got bit. He denies any other injuries. The dog belongs to the family he is living with and they will keep the dog up for the next 10 days. Patient unsure of his tetanus status  History reviewed. No pertinent past medical history. History reviewed. No pertinent past surgical history. No family history on file. History  Substance Use Topics  . Smoking status: Current Some Day Smoker    Types: Cigarettes  . Smokeless tobacco: Not on file  . Alcohol Use: Yes     Comment: occ .use    Review of Systems Negative except as stated in HPI   Allergies  Amoxicillin and Penicillins  Home Medications   Prior to Admission medications   Medication Sig Start Date End Date Taking? Authorizing Provider  cyclobenzaprine (FLEXERIL) 10 MG tablet Take 1 tablet (10 mg total) by mouth 3 (three)  times daily as needed. Patient not taking: Reported on 01/01/2015 06/12/14   Tammi Triplett, PA-C  doxycycline (VIBRAMYCIN) 100 MG capsule Take 1 capsule (100 mg total) by mouth 2 (two) times daily. 01/01/15   Kameryn Tisdel Orlene OchM Earlyne Feeser, NP  guaiFENesin-codeine (ROBITUSSIN AC) 100-10 MG/5ML syrup Take 10 mLs by mouth 3 (three) times daily as needed for cough. Patient not taking: Reported on 01/01/2015 01/02/14   Tammi Triplett, PA-C  naproxen (NAPROSYN) 500 MG tablet Take 1 tablet (500 mg total) by mouth 2 (two) times daily with a meal. Patient not taking: Reported on 01/01/2015 06/12/14   Tammi Triplett, PA-C   BP 129/68 mmHg  Pulse 96  Temp(Src) 98 F (36.7 C) (Oral)  Resp 14  Ht 5\' 10"  (1.778 m)  Wt 248 lb (112.492 kg)  BMI 35.58 kg/m2  SpO2 100% Physical Exam  Constitutional: He is oriented to person, place, and time. He appears well-developed and well-nourished.  HENT:  Head: Normocephalic.  Eyes: EOM are normal.  Neck: Neck supple.  Cardiovascular: Normal rate.   Pulmonary/Chest: Effort normal.  Abdominal: Soft. There is no tenderness.  Musculoskeletal:       Left wrist: He exhibits tenderness. He exhibits normal range of motion, no swelling and no deformity. Lacerations: puncture wound.       Arms: Radial pulse strong, adequate circulation, good touch sensation. There are 2 puncture wounds noted to the left  wrist.   Neurological: He is alert and oriented to person, place, and time. No cranial nerve deficit.  Skin: Skin is warm and dry.  Psychiatric: He has a normal mood and affect. His behavior is normal.  Nursing note and vitals reviewed.   ED Course  Procedures (including critical care time) Wounds scrubbed with betadine and soaked in NSS.  [place] [employer] here to take report from patient. Family dog will be observed  Labs Review Labs Reviewed - No data to display  Imaging Review Dg Wrist Complete Left  01/01/2015   CLINICAL DATA:  Puncture wound from dog bite  EXAM: LEFT WRIST -  COMPLETE 3+ VIEW  COMPARISON:  None.  FINDINGS: There is no evidence of fracture or dislocation. There is no evidence of arthropathy or other focal bone abnormality. There is soft tissue emphysema in the dorsal ulnar aspect of the wrist from a dog bite.  IMPRESSION: There is soft tissue emphysema in the dorsal ulnar aspect of the wrist from a dog bite. There is no acute osseous abnormality.   Electronically Signed   By: Elige Ko   On: 01/01/2015 16:37     MDM  24 y.o. male with dog bite to the left wrist. Stable for d/c without neurovascular deficits. Discussed with the patient importance of antibiotics. Will start doxycycline since patient is allergic to penicillins.  Final diagnoses:  Dog bite        [facility], NP 01/03/15 1012  Bethann Berkshire, MD 01/05/15 [phone]

## 2015-01-01 NOTE — Discharge Instructions (Signed)

## 2015-12-17 ENCOUNTER — Emergency Department (HOSPITAL_COMMUNITY)
Admission: EM | Admit: 2015-12-17 | Discharge: 2015-12-17 | Disposition: A | Discharge status: Home / Self Care | Payer: Self-pay | Attending: Emergency Medicine | Admitting: Emergency Medicine

## 2015-12-17 ENCOUNTER — Encounter (HOSPITAL_COMMUNITY): Payer: Self-pay | Admitting: *Deleted

## 2015-12-17 DIAGNOSIS — R112 Nausea with vomiting, unspecified: Secondary | ICD-10-CM | POA: Insufficient documentation

## 2015-12-17 DIAGNOSIS — R197 Diarrhea, unspecified: Secondary | ICD-10-CM | POA: Insufficient documentation

## 2015-12-17 DIAGNOSIS — Z87891 Personal history of nicotine dependence: Secondary | ICD-10-CM | POA: Insufficient documentation

## 2015-12-17 MED ORDER — ONDANSETRON 8 MG PO TBDP
8.0000 mg | ORAL_TABLET | Freq: Three times a day (TID) | ORAL | Status: DC | PRN
Start: 2015-12-17 — End: 2017-01-17

## 2015-12-17 NOTE — ED Notes (Signed)
Pt states LUQ abdominal pain was present when he woke up this morning. States vomited x 3 and diarrhea as well. Also states same symptoms ~ 1 week ago and a month or two before then.

## 2015-12-17 NOTE — Discharge Instructions (Signed)
Nausea and Vomiting  Nausea means you feel sick to your stomach. Throwing up (vomiting) is a reflex where stomach contents come out of your mouth.  HOME CARE   · Take medicine as told by your doctor.  · Do not force yourself to eat. However, you do need to drink fluids.  · If you feel like eating, eat a normal diet as told by your doctor.    Eat rice, wheat, potatoes, bread, lean meats, yogurt, fruits, and vegetables.    Avoid high-fat foods.  · Drink enough fluids to keep your pee (urine) clear or pale yellow.  · Ask your doctor how to replace body fluid losses (rehydrate). Signs of body fluid loss (dehydration) include:    Feeling very thirsty.    Dry lips and mouth.    Feeling dizzy.    Dark pee.    Peeing less than normal.    Feeling confused.    Fast breathing or heart rate.  GET HELP RIGHT AWAY IF:   · You have blood in your throw up.  · You have black or bloody poop (stool).  · You have a bad headache or stiff neck.  · You feel confused.  · You have bad belly (abdominal) pain.  · You have chest pain or trouble breathing.  · You do not pee at least once every 8 hours.  · You have cold, clammy skin.  · You keep throwing up after 24 to 48 hours.  · You have a fever.  MAKE SURE YOU:   · Understand these instructions.  · Will watch your condition.  · Will get help right away if you are not doing well or get worse.     This information is not intended to replace advice given to you by your health care provider. Make sure you discuss any questions you have with your health care provider.     Document Released: 03/21/2008 Document Revised: 12/26/2011 Document Reviewed: 03/04/2011  Elsevier Interactive Patient Education ©2016 [employer]

## 2015-12-17 NOTE — ED Provider Notes (Signed)
CSN: [id number]     Arrival date & time 12/17/15  4098 History   First MD Initiated Contact with Patient 12/17/15 [phone]     Chief Complaint  Patient presents with  . Abdominal Pain     (Consider location/radiation/quality/duration/timing/severity/associated sxs/prior Treatment) HPI 25 year old male previously healthy presents today stating that he woke up with some nausea and vomited 3 times. He  describes the emesis as food from before. There is no blood or bile in it. He had some loose bowel movements. He had some upper abdominal cramping. He states he has had some similar episodes in the past which has resolved spontaneously. After the episodes of vomiting he was able to drink water and the discomfort and nausea have resolved. He was not vomiting after the water. He has not had any definite sick exposures. He denies fever or chills. He has no prior surgeries. History reviewed. No pertinent past medical history. History reviewed. No pertinent past surgical history. No family history on file. Social History  Substance Use Topics  . Smoking status: Former Smoker    Types: Cigarettes  . Smokeless tobacco: None  . Alcohol Use: No    Review of Systems  All other systems reviewed and are negative.     Allergies  Amoxicillin and Penicillins  Home Medications   Prior to Admission medications   Medication Sig Start Date End Date Taking? Authorizing Provider  ondansetron (ZOFRAN ODT) 8 MG disintegrating tablet Take 1 tablet (8 mg total) by mouth every 8 (eight) hours as needed for nausea or vomiting. 12/17/15   Margarita Grizzle, MD   BP 119/79 mmHg  Pulse 59  Temp(Src) 97.7 F (36.5 C) (Oral)  Resp 16  Ht  (1.778 m)  Wt 108.863 kg  BMI 34.44 kg/m2  SpO2 100% Physical Exam  Constitutional: He is oriented to person, place, and time. He appears well-developed and well-nourished. No distress.  HENT:  Head: Normocephalic and atraumatic.  Right Ear: External ear normal.  Left Ear:  External ear normal.  Nose: Nose normal.  Eyes: Conjunctivae and EOM are normal. Pupils are equal, round, and reactive to light.  Neck: Normal range of motion. Neck supple. No tracheal deviation present.  Cardiovascular: Normal rate, regular rhythm and normal heart sounds.   Pulmonary/Chest: Effort normal.  Abdominal: Soft. Bowel sounds are normal. He exhibits no distension and no mass. There is no tenderness. There is no rebound and no guarding.  Musculoskeletal: Normal range of motion.  Neurological: He is alert and oriented to person, place, and time.  Skin: Skin is warm and dry.  Psychiatric: He has a normal mood and affect. His behavior is normal. Judgment and thought content normal.  Nursing note and vitals reviewed.   ED Course  Procedures (including critical care time) Labs Review Labs Reviewed - No data to display  Imaging Review No results found. I have personally reviewed and evaluated these images and lab results as part of my medical decision-making.   EKG Interpretation None      MDM   Final diagnoses:  Non-intractable vomiting with nausea, vomiting of unspecified type   25 year old previously healthy male with nausea and vomiting. Abdomen is soft and nontender. He has tolerated fluid without vomiting. He is given a prescription for Zofran. I discussed return precautions such as return of abdominal pain or inability to keep down fluids. He voices understanding of return precautions and need for follow-up.   Margarita Grizzle, MD 12/17/15 1000

## 2016-09-23 ENCOUNTER — Encounter (HOSPITAL_COMMUNITY): Payer: Self-pay | Admitting: Emergency Medicine

## 2016-09-23 ENCOUNTER — Emergency Department (HOSPITAL_COMMUNITY)
Admission: EM | Admit: 2016-09-23 | Discharge: 2016-09-23 | Disposition: A | Discharge status: Home / Self Care | Payer: Self-pay | Attending: Emergency Medicine | Admitting: Emergency Medicine

## 2016-09-23 DIAGNOSIS — J069 Acute upper respiratory infection, unspecified: Secondary | ICD-10-CM | POA: Insufficient documentation

## 2016-09-23 DIAGNOSIS — Z79899 Other long term (current) drug therapy: Secondary | ICD-10-CM | POA: Insufficient documentation

## 2016-09-23 DIAGNOSIS — Z87891 Personal history of nicotine dependence: Secondary | ICD-10-CM | POA: Insufficient documentation

## 2016-09-23 NOTE — ED Provider Notes (Signed)
[facility]-EMERGENCY DEPT Provider Note   CSN: [id number] Arrival date & time: 09/23/16  1904     History   Chief Complaint Chief Complaint  Patient presents with  . flu like symptoms    HPI Thomas Potts is a 25 y.o. male.  The history is provided by the patient. No language interpreter was used.  Cough  This is a new problem. The current episode started 2 days ago. The problem occurs constantly. The cough is productive of sputum. There has been no fever. Pertinent negatives include no chest pain. He has tried nothing for the symptoms. The treatment provided no relief. He is not a smoker. His past medical history does not include bronchitis or pneumonia.    History reviewed. No pertinent past medical history.  There are no active problems to display for this patient.   History reviewed. No pertinent surgical history.     Home Medications    Prior to Admission medications   Medication Sig Start Date End Date Taking? Authorizing Provider  ondansetron (ZOFRAN ODT) 8 MG disintegrating tablet Take 1 tablet (8 mg total) by mouth every 8 (eight) hours as needed for nausea or vomiting. 12/17/15   Margarita Grizzleanielle Ray, MD    Family History History reviewed. No pertinent family history.  Social History Social History  Substance Use Topics  . Smoking status: Former Smoker    Types: Cigarettes  . Smokeless tobacco: Never Used  . Alcohol use No     Allergies   Amoxicillin and Penicillins   Review of Systems Review of Systems  Respiratory: Positive for cough.   Cardiovascular: Negative for chest pain.  All other systems reviewed and are negative.    Physical Exam Updated Vital Signs BP 104/78 (BP Location: Left Arm)   Pulse 73   Temp 98.3 F (36.8 C) (Oral)   Resp 20   Ht 5\' 10"  (1.778 m)   Wt 113.4 kg   SpO2 100%   BMI 35.87 kg/m   Physical Exam  Constitutional: He appears well-developed and well-nourished.  HENT:  Head: Normocephalic and atraumatic.    Right Ear: External ear normal.  Left Ear: External ear normal.  Nose: Nose normal.  Mouth/Throat: Oropharynx is clear and moist.  Eyes: Conjunctivae are normal.  Neck: Neck supple.  Cardiovascular: Normal rate and regular rhythm.   No murmur heard. Pulmonary/Chest: Effort normal and breath sounds normal. No respiratory distress.  Abdominal: Soft. There is no tenderness.  Musculoskeletal: Normal range of motion. He exhibits no edema.  Neurological: He is alert.  Skin: Skin is warm and dry.  Psychiatric: He has a normal mood and affect.  Nursing note and vitals reviewed.    ED Treatments / Results  Labs (all labs ordered are listed, but only abnormal results are displayed) Labs Reviewed - No data to display  EKG  EKG Interpretation None       Radiology No results found.  Procedures Procedures (including critical care time)  Medications Ordered in ED Medications - No data to display   Initial Impression / Assessment and Plan / ED Course  I have reviewed the triage vital signs and the nursing notes.  Pertinent labs & imaging results that were available during my care of the patient were reviewed by me and considered in my medical decision making (see chart for details).  Clinical Course     I counseled pt on viral illness.   I advised cough drops, tylenol,  Drink plenty of liquids.  Final Clinical Impressions(s) /  ED Diagnoses   Final diagnoses:  Viral upper respiratory tract infection    New Prescriptions New Prescriptions   No medications on file  An After Visit Summary was printed and given to the patient.   Lonia SkinnerLeslie K RockfordSofia, PA-C 09/23/16 [id number]    Samuel JesterKathleen McManus, DO 09/24/16 2256

## 2016-09-23 NOTE — ED Triage Notes (Signed)
Pt reports cough, weakness and body aches that started yesterday. Pt states he has had emesis with cough.

## 2017-01-17 ENCOUNTER — Emergency Department (HOSPITAL_COMMUNITY): Payer: Self-pay

## 2017-01-17 ENCOUNTER — Emergency Department (HOSPITAL_COMMUNITY)
Admission: EM | Admit: 2017-01-17 | Discharge: 2017-01-18 | Disposition: A | Discharge status: Home / Self Care | Payer: Self-pay | Attending: Emergency Medicine | Admitting: Emergency Medicine

## 2017-01-17 ENCOUNTER — Encounter (HOSPITAL_COMMUNITY): Payer: Self-pay | Admitting: Emergency Medicine

## 2017-01-17 DIAGNOSIS — R1084 Generalized abdominal pain: Secondary | ICD-10-CM

## 2017-01-17 DIAGNOSIS — R197 Diarrhea, unspecified: Secondary | ICD-10-CM

## 2017-01-17 DIAGNOSIS — Z87891 Personal history of nicotine dependence: Secondary | ICD-10-CM | POA: Insufficient documentation

## 2017-01-17 DIAGNOSIS — R112 Nausea with vomiting, unspecified: Secondary | ICD-10-CM

## 2017-01-17 DIAGNOSIS — K8020 Calculus of gallbladder without cholecystitis without obstruction: Secondary | ICD-10-CM

## 2017-01-17 LAB — CBC WITH DIFFERENTIAL/PLATELET
Basophils Absolute: 0 10*3/uL (ref 0.0–0.1)
Basophils Relative: 0 %
Eosinophils Absolute: 0 10*3/uL (ref 0.0–0.7)
Eosinophils Relative: 0 %
HEMATOCRIT: 42.4 % (ref 39.0–52.0)
HEMOGLOBIN: 14.9 g/dL (ref 13.0–17.0)
LYMPHS PCT: 4 %
Lymphs Abs: 0.6 10*3/uL — ABNORMAL LOW (ref 0.7–4.0)
MCH: 31.6 pg (ref 26.0–34.0)
MCHC: 35.1 g/dL (ref 30.0–36.0)
MCV: 90 fL (ref 78.0–100.0)
MONOS PCT: 3 %
Monocytes Absolute: 0.5 10*3/uL (ref 0.1–1.0)
NEUTROS ABS: 16.4 10*3/uL — AB (ref 1.7–7.7)
NEUTROS PCT: 93 %
Platelets: 166 10*3/uL (ref 150–400)
RBC: 4.71 MIL/uL (ref 4.22–5.81)
RDW: 12.1 % (ref 11.5–15.5)
WBC: 17.6 10*3/uL — ABNORMAL HIGH (ref 4.0–10.5)

## 2017-01-17 LAB — COMPREHENSIVE METABOLIC PANEL
ALBUMIN: 5 g/dL (ref 3.5–5.0)
ALT: 33 U/L (ref 17–63)
AST: 27 U/L (ref 15–41)
Alkaline Phosphatase: 45 U/L (ref 38–126)
Anion gap: 9 (ref 5–15)
BILIRUBIN TOTAL: 1.1 mg/dL (ref 0.3–1.2)
BUN: 14 mg/dL (ref 6–20)
CALCIUM: 9.7 mg/dL (ref 8.9–10.3)
CHLORIDE: 104 mmol/L (ref 101–111)
CO2: 26 mmol/L (ref 22–32)
CREATININE: 0.89 mg/dL (ref 0.61–1.24)
GFR calc Af Amer: >60 mL/min (ref >60)
GFR calc non Af Amer: >60 mL/min (ref >60)
GLUCOSE: 131 mg/dL — AB (ref 65–99)
Potassium: 4.6 mmol/L (ref 3.5–5.1)
Sodium: 139 mmol/L (ref 135–145)
Total Protein: 7.8 g/dL (ref 6.5–8.1)

## 2017-01-17 LAB — CBC
HCT: 46.3 % (ref 39.0–52.0)
Hemoglobin: 16.3 g/dL (ref 13.0–17.0)
MCH: 31.8 pg (ref 26.0–34.0)
MCHC: 35.2 g/dL (ref 30.0–36.0)
MCV: 90.3 fL (ref 78.0–100.0)
PLATELETS: 188 10*3/uL (ref 150–400)
RBC: 5.13 MIL/uL (ref 4.22–5.81)
RDW: 12.1 % (ref 11.5–15.5)
WBC: 21.3 10*3/uL — ABNORMAL HIGH (ref 4.0–10.5)

## 2017-01-17 LAB — URINALYSIS, ROUTINE W REFLEX MICROSCOPIC
Bacteria, UA: NONE SEEN
Bilirubin Urine: NEGATIVE
GLUCOSE, UA: NEGATIVE mg/dL
Hgb urine dipstick: NEGATIVE
Ketones, ur: 80 mg/dL — AB
Leukocytes, UA: NEGATIVE
Nitrite: NEGATIVE
PH: 7 (ref 5.0–8.0)
Protein, ur: 30 mg/dL — AB
RBC / HPF: NONE SEEN RBC/hpf (ref 0–5)
SPECIFIC GRAVITY, URINE: 1.028 (ref 1.005–1.030)

## 2017-01-17 LAB — LIPASE, BLOOD: LIPASE: 24 U/L (ref 11–51)

## 2017-01-17 MED ORDER — SODIUM CHLORIDE 0.9 % IV BOLUS (SEPSIS)
1000.0000 mL | Freq: Once | INTRAVENOUS | Status: AC
  Administered 2017-01-17: 1000 mL via INTRAVENOUS

## 2017-01-17 MED ORDER — ONDANSETRON HCL 4 MG/2ML IJ SOLN
4.0000 mg | INTRAMUSCULAR | Status: AC | PRN
  Administered 2017-01-17 (×2): 4 mg via INTRAVENOUS
  Filled 2017-01-17 (×2): qty 2

## 2017-01-17 MED ORDER — PROMETHAZINE HCL 25 MG/ML IJ SOLN
12.5000 mg | Freq: Once | INTRAMUSCULAR | Status: AC
  Administered 2017-01-17: 12.5 mg via INTRAVENOUS
  Filled 2017-01-17: qty 1

## 2017-01-17 MED ORDER — ONDANSETRON HCL 4 MG/2ML IJ SOLN
INTRAMUSCULAR | Status: AC
  Administered 2017-01-17: 4 mg via INTRAVENOUS
  Filled 2017-01-17: qty 2

## 2017-01-17 MED ORDER — IOPAMIDOL (ISOVUE-300) INJECTION 61%
100.0000 mL | Freq: Once | INTRAVENOUS | Status: AC | PRN
  Administered 2017-01-17: 100 mL via INTRAVENOUS

## 2017-01-17 MED ORDER — MORPHINE SULFATE (PF) 2 MG/ML IV SOLN: 2.0000 mg | INTRAVENOUS | Status: DC | PRN

## 2017-01-17 MED ORDER — ONDANSETRON HCL 4 MG/2ML IJ SOLN
4.0000 mg | Freq: Once | INTRAMUSCULAR | Status: AC | PRN
  Administered 2017-01-17: 4 mg via INTRAVENOUS

## 2017-01-17 NOTE — ED Provider Notes (Signed)
[facility]-EMERGENCY DEPT Provider Note   CSN: [id number] Arrival date & time: 01/17/17  1424     History   Chief Complaint Chief Complaint  Patient presents with  . Abdominal Pain    HPI Thomas Potts is a 26 y.o. male.  HPI Pt was seen at 1635. Per pt, c/o gradual onset and persistence of multiple intermittent episodes of N/V/D that began overnight last night.   Describes the stools as "watery."  Has been associated with "aching" generalized abd pain. States his daugher had similar symptoms recently. Denies CP/SOB, no back pain, no fevers, no black or blood in stools or emesis.     History reviewed. No pertinent past medical history.  There are no active problems to display for this patient.   History reviewed. No pertinent surgical history.     Home Medications    Prior to Admission medications   Not on File    Family History History reviewed. No pertinent family history.  Social History Social History  Substance Use Topics  . Smoking status: Former Smoker    Types: Cigarettes  . Smokeless tobacco: Never Used  . Alcohol use No     Allergies   Amoxicillin and Penicillins   Review of Systems Review of Systems ROS: Statement: All systems negative except as marked or noted in the HPI; Constitutional: Negative for fever and chills. ; ; Eyes: Negative for eye pain, redness and discharge. ; ; ENMT: Negative for ear pain, hoarseness, nasal congestion, sinus pressure and sore throat. ; ; Cardiovascular: Negative for chest pain, palpitations, diaphoresis, dyspnea and peripheral edema. ; ; Respiratory: Negative for cough, wheezing and stridor. ; ; Gastrointestinal: +N/V/D, abd pain. Negative for blood in stool, hematemesis, jaundice and rectal bleeding. . ; ; Genitourinary: Negative for dysuria, flank pain and hematuria. ; ; Genital:  No penile drainage or rash, no testicular pain or swelling, no scrotal rash or swelling. ;; Musculoskeletal: Negative for back pain  and neck pain. Negative for swelling and trauma.; ; Skin: Negative for pruritus, rash, abrasions, blisters, bruising and skin lesion.; ; Neuro: Negative for headache, lightheadedness and neck stiffness. Negative for weakness, altered level of consciousness, altered mental status, extremity weakness, paresthesias, involuntary movement, seizure and syncope.       Physical Exam Updated Vital Signs BP 127/75   Pulse 69   Temp 98.8 F (37.1 C) (Oral)   Resp 18   Ht  (1.778 m)   Wt 250 lb (113.4 kg)   SpO2 100%   BMI 35.87 kg/m   Physical Exam 1640: Physical examination:  Nursing notes reviewed; Vital signs and O2 SAT reviewed;  Constitutional: Well developed, Well nourished, Well hydrated, In no acute distress; Head:  Normocephalic, atraumatic; Eyes: EOMI, PERRL, No scleral icterus; ENMT: Mouth and pharynx normal, Mucous membranes moist; Neck: Supple, Full range of motion, No lymphadenopathy; Cardiovascular: Regular rate and rhythm, No gallop; Respiratory: Breath sounds clear & equal bilaterally, No wheezes.  Speaking full sentences with ease, Normal respiratory effort/excursion; Chest: Nontender, Movement normal; Abdomen: Soft, +mild generalized tenderness to palp. No rebound or guarding. Nondistended, Normal bowel sounds; Genitourinary: No CVA tenderness; Extremities: Pulses normal, No tenderness, No edema, No calf edema or asymmetry.; Neuro: AA&Ox3, Major CN grossly intact.  Speech clear. No gross focal motor or sensory deficits in extremities.; Skin: Color normal, Warm, Dry.   ED Treatments / Results  Labs (all labs ordered are listed, but only abnormal results are displayed)   EKG  EKG Interpretation None  Radiology   Procedures Procedures (including critical care time)  Medications Ordered in ED Medications  morphine 2 MG/ML injection 2 mg (not administered)  ondansetron (ZOFRAN) injection 4 mg (not administered)  ondansetron (ZOFRAN) injection 4 mg (4 mg  Intravenous Given 01/17/17 1625)  sodium chloride 0.9 % bolus 1,000 mL (1,000 mLs Intravenous New Bag/Given 01/17/17 1713)  iopamidol (ISOVUE-300) 61 % injection 100 mL (100 mLs Intravenous Contrast Given 01/17/17 1701)     Initial Impression / Assessment and Plan / ED Course  I have reviewed the triage vital signs and the nursing notes.  Pertinent labs & imaging results that were available during my care of the patient were reviewed by me and considered in my medical decision making (see chart for details).  MDM Reviewed: previous chart, nursing note and vitals Reviewed previous: labs Interpretation: labs and CT scan   Results for orders placed or performed during the hospital encounter of 01/17/17  Lipase, blood  Result Value Ref Range   Lipase 24 11 - 51 U/L  Comprehensive metabolic panel  Result Value Ref Range   Sodium 139 135 - 145 mmol/L   Potassium 4.6 3.5 - 5.1 mmol/L   Chloride 104 101 - 111 mmol/L   CO2 26 22 - 32 mmol/L   Glucose, Bld 131 (H) 65 - 99 mg/dL   BUN 14 6 - 20 mg/dL   Creatinine, Ser 1.61 0.61 - 1.24 mg/dL   Calcium 9.7 8.9 - 09.6 mg/dL   Total Protein 7.8 6.5 - 8.1 g/dL   Albumin 5.0 3.5 - 5.0 g/dL   AST 27 15 - 41 U/L   ALT 33 17 - 63 U/L   Alkaline Phosphatase 45 38 - 126 U/L   Total Bilirubin 1.1 0.3 - 1.2 mg/dL   GFR calc non Af Amer >60 >60 mL/min   GFR calc Af Amer >60 >60 mL/min   Anion gap 9 5 - 15  CBC  Result Value Ref Range   WBC 21.3 (H) 4.0 - 10.5 K/uL   RBC 5.13 4.22 - 5.81 MIL/uL   Hemoglobin 16.3 13.0 - 17.0 g/dL   HCT 04.5 40.9 - 81.1 %   MCV 90.3 78.0 - 100.0 fL   MCH 31.8 26.0 - 34.0 pg   MCHC 35.2 30.0 - 36.0 g/dL   RDW 91.4 78.2 - 95.6 %   Platelets 188 150 - 400 K/uL  Urinalysis, Routine w reflex microscopic  Result Value Ref Range   Color, Urine YELLOW YELLOW   APPearance CLEAR CLEAR   Specific Gravity, Urine 1.028 1.005 - 1.030   pH 7.0 5.0 - 8.0   Glucose, UA NEGATIVE NEGATIVE mg/dL   Hgb urine dipstick NEGATIVE  NEGATIVE   Bilirubin Urine NEGATIVE NEGATIVE   Ketones, ur 80 (A) NEGATIVE mg/dL   Protein, ur 30 (A) NEGATIVE mg/dL   Nitrite NEGATIVE NEGATIVE   Leukocytes, UA NEGATIVE NEGATIVE   RBC / HPF NONE SEEN 0 - 5 RBC/hpf   WBC, UA 0-5 0 - 5 WBC/hpf   Bacteria, UA NONE SEEN NONE SEEN   Squamous Epithelial / LPF 0-5 (A) NONE SEEN   Mucous PRESENT   CBC with Differential  Result Value Ref Range   WBC 17.6 (H) 4.0 - 10.5 K/uL   RBC 4.71 4.22 - 5.81 MIL/uL   Hemoglobin 14.9 13.0 - 17.0 g/dL   HCT 21.3 08.6 - 57.8 %   MCV 90.0 78.0 - 100.0 fL   MCH 31.6 26.0 - 34.0 pg  MCHC 35.1 30.0 - 36.0 g/dL   RDW 29.5 62.1 - 30.8 %   Platelets 166 150 - 400 K/uL   Neutrophils Relative % 93 %   Neutro Abs 16.4 (H) 1.7 - 7.7 K/uL   Lymphocytes Relative 4 %   Lymphs Abs 0.6 (L) 0.7 - 4.0 K/uL   Monocytes Relative 3 %   Monocytes Absolute 0.5 0.1 - 1.0 K/uL   Eosinophils Relative 0 %   Eosinophils Absolute 0.0 0.0 - 0.7 K/uL   Basophils Relative 0 %   Basophils Absolute 0.0 0.0 - 0.1 K/uL   Ct Abdomen Pelvis W Contrast Result Date: 01/17/2017 CLINICAL DATA:  Upper abdominal pain with nausea and vomiting since 4 a.m. today. EXAM: CT ABDOMEN AND PELVIS WITH CONTRAST TECHNIQUE: Multidetector CT imaging of the abdomen and pelvis was performed using the standard protocol following bolus administration of intravenous contrast. CONTRAST:  ISOVUE-300 IOPAMIDOL (ISOVUE-300) INJECTION 61% COMPARISON:  None. FINDINGS: Lower chest: No significant pulmonary nodules or acute consolidative airspace disease. Hepatobiliary: Diffuse hepatic steatosis. Normal liver size. No definite liver surface irregularity. No liver mass. Cholelithiasis. No significant gallbladder distention. No gallbladder wall thickening or pericholecystic fluid. No biliary ductal dilatation. Common bile duct diameter 3 mm. Pancreas: Normal, with no mass or duct dilation. Spleen: Borderline enlarged spleen (craniocaudal splenic length 13.3 cm). No  splenic mass. Adrenals/Urinary Tract: Normal adrenals. Normal kidneys with no hydronephrosis and no renal mass. Normal bladder. Stomach/Bowel: Tiny hiatal hernia. Otherwise collapsed and grossly normal stomach. Normal caliber small bowel with no small bowel wall thickening. Normal appendix . Normal large bowel with no diverticulosis, large bowel wall thickening or pericolonic fat stranding. Vascular/Lymphatic: Normal caliber abdominal aorta. Patent portal, splenic, hepatic and renal veins. No pathologically enlarged lymph nodes in the abdomen or pelvis. Reproductive: Normal size prostate. Other: No pneumoperitoneum, ascites or focal fluid collection. Musculoskeletal: No aggressive appearing focal osseous lesions. Sclerotic lesions in the bilateral proximal femora, largest in the right femoral head, nonspecific, probably benign bone islands. IMPRESSION: 1. Cholelithiasis. No CT evidence of acute cholecystitis. No biliary ductal dilatation. Consider correlation with right upper quadrant abdominal sonogram as clinically warranted. 2. No evidence of bowel obstruction or acute bowel inflammation. Normal appendix. 3. Tiny hiatal hernia. 4. Borderline enlarged spleen. If the patient is above average in height, this may represent a normal spleen size. No abdominopelvic lymphadenopathy. 5. Diffuse hepatic steatosis. Electronically Signed   By: Delbert Phenix M.D.   On: 01/17/2017 17:24    US Abdomen Limited Ruq Result Date: 01/17/2017 CLINICAL DATA:  Initial evaluation for acute mid abdominal pain, nausea, vomiting. EXAM: US ABDOMEN LIMITED - RIGHT UPPER QUADRANT COMPARISON:  Prior CT from earlier the same day. FINDINGS: Gallbladder: Large shadowing echogenic stone present within the gallbladder lumen, measuring 2.4 x 2.5 cm. Gallbladder wall measure within normal limits at 2 mm. No free pericholecystic fluid. No sonographic Murphy sign elicited on exam. Common bile duct: Diameter: 4.8 mm Liver: No focal lesion identified.  Within normal limits in parenchymal echogenicity. IMPRESSION: 2.5 cm shadowing stone within the gallbladder lumen. No other sonographic features to suggest acute cholecystitis. No biliary dilatation. Electronically Signed   By: Rise Mu M.D.   On: 01/17/2017 20:16     2230:  WBC count trending downward after IVF; initial elevation likely due to demargination d/t pt vomiting on arrival to ED. Workup otherwise reassuring.  Pt medicated with IV zofran several times; improves and is able to tol PO, then emesis returns. Will dose IV  phenergan. Will need another PO challenge. Sign out to oncoming EDP.    Final Clinical Impressions(s) / ED Diagnoses   Final diagnoses:  None    New Prescriptions New Prescriptions   No medications on file     Samuel Jester, DO 01/17/17 2249

## 2017-01-17 NOTE — ED Notes (Signed)
Fluid challenge not done due to abdominal US ordered. EDP notified, ok to wait until US done to do fluid challenge.

## 2017-01-17 NOTE — ED Notes (Signed)
Pt actively vomiting when brought from triage to ED room. Standing orders for Zofran  IV given.

## 2017-01-17 NOTE — ED Triage Notes (Signed)
Patient complaining of abdominal pain with vomiting and diarrhea since 0400 this morning.

## 2017-01-17 NOTE — ED Notes (Signed)
Patient requested Ginger Ale.  Ginger Ale given to him at this time.  Instructed to take small sips.

## 2017-01-17 NOTE — ED Provider Notes (Signed)
Assumed care from Dr. Clarene Duke. Patient with vomiting and nausea and diarrhea with sick contacts at home. By mouth challenge pending.  Patient feels improved after Phenergan is tolerating by mouth. Abdomen is soft. Patient informed incidental gallstone finding. Does not appear to have cholecystitis. LFTs and lipase are normal. No right upper quadrant pain.  Treat supportively for presumed gastroenteritis. Follow-up with PCP. Also referred to surgery given his gallstone. Return precautions discussed.  BP (!) 141/85 (BP Location: Left Arm)   Pulse 72   Temp 98.8 F (37.1 C) (Oral)   Resp 16   Ht  (1.778 m)   Wt 250 lb (113.4 kg)   SpO2 100%   BMI 35.87 kg/m     Thomas Octave, MD 01/18/17 0003

## 2017-01-18 MED ORDER — PROMETHAZINE HCL 25 MG PO TABS: 25.0000 mg | ORAL_TABLET | Freq: Four times a day (QID) | ORAL | 0 refills | Status: DC | PRN

## 2017-01-18 NOTE — Discharge Instructions (Signed)
Keep yourself hydrated. Use the nausea medicine as needed. Follow up with the surgeon regarding your gallstone. Return to the ED if you develop new or worsening symptoms.

## 2017-09-24 IMAGING — US US ABDOMEN LIMITED
1 series · 14 of 25 positions shown · non-contrast
Comparison: Prior CT from earlier the same day.

CLINICAL DATA: Initial evaluation for acute mid abdominal pain,
nausea, vomiting.

EXAM:
US ABDOMEN LIMITED - RIGHT UPPER QUADRANT

[Series 1: us abdomen limited · 0.19mm/px · 14 of 63 slices shown]
[im 1/63]
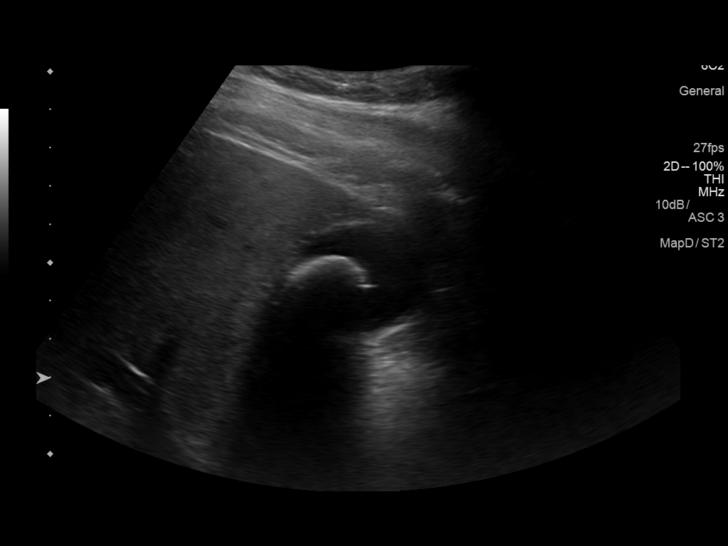
[im 6/63]
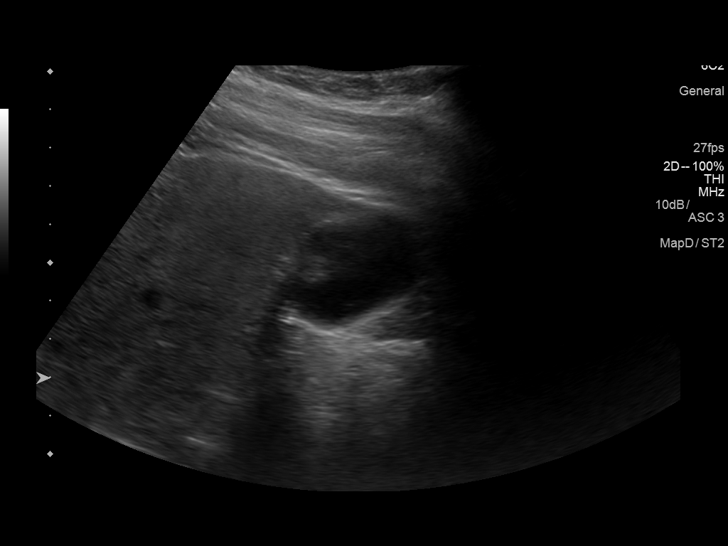
[im 11/63]
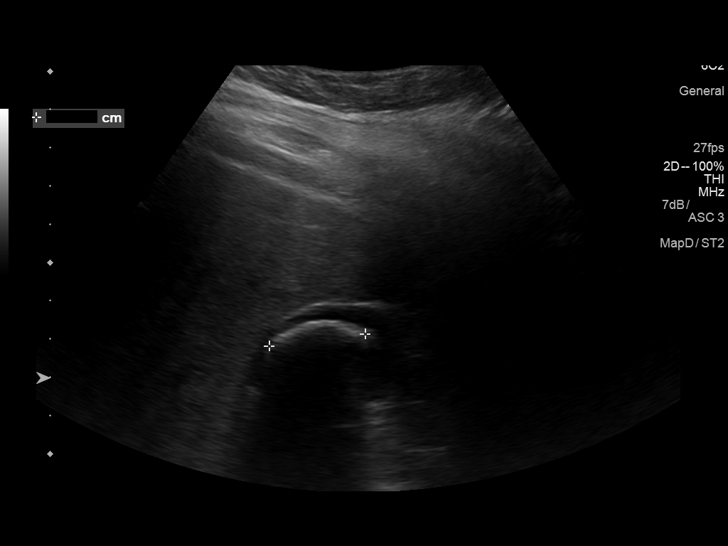
[im 16/63]
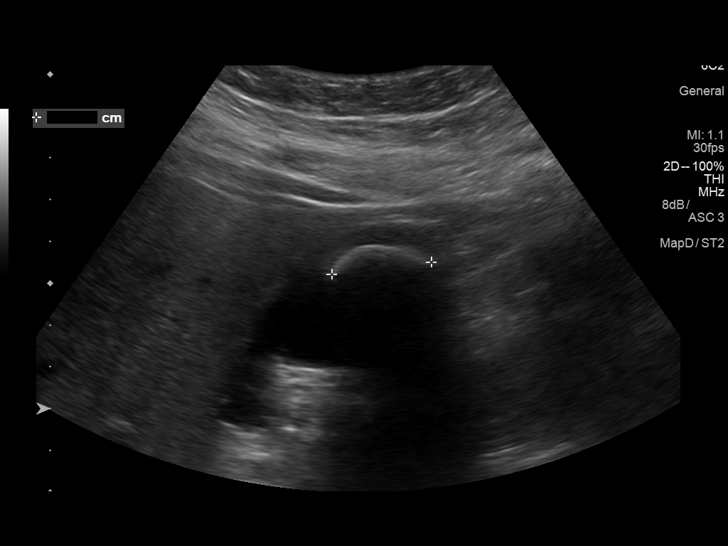
[im 21/63]
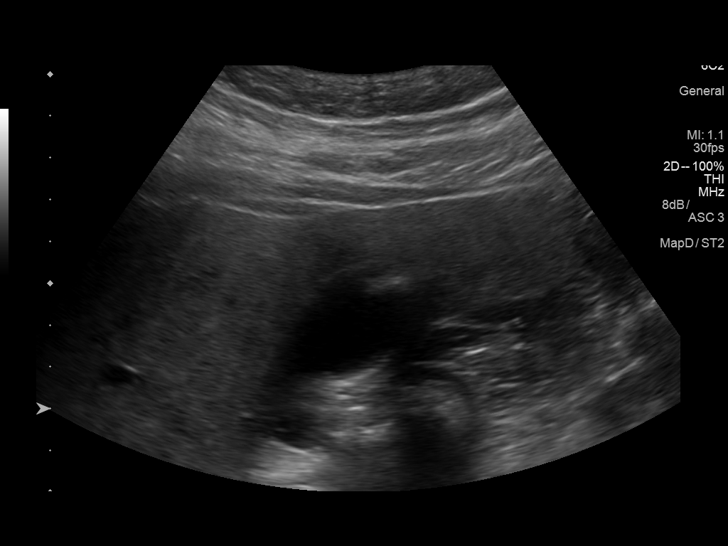
[im 24/63]
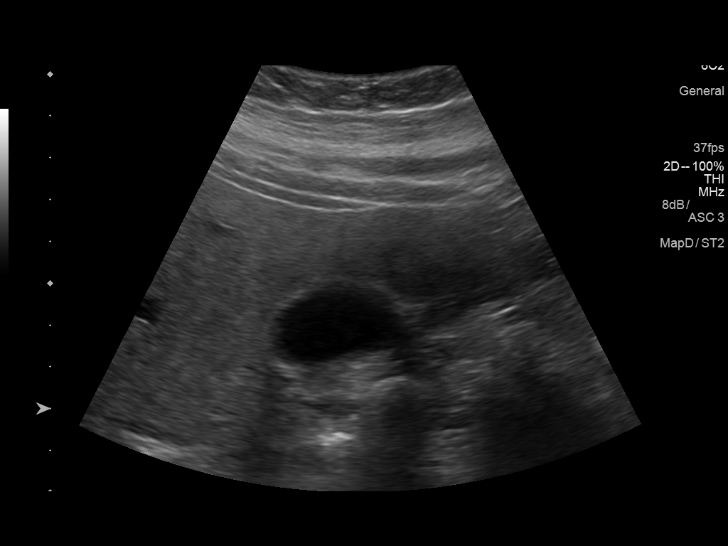
[im 29/63]
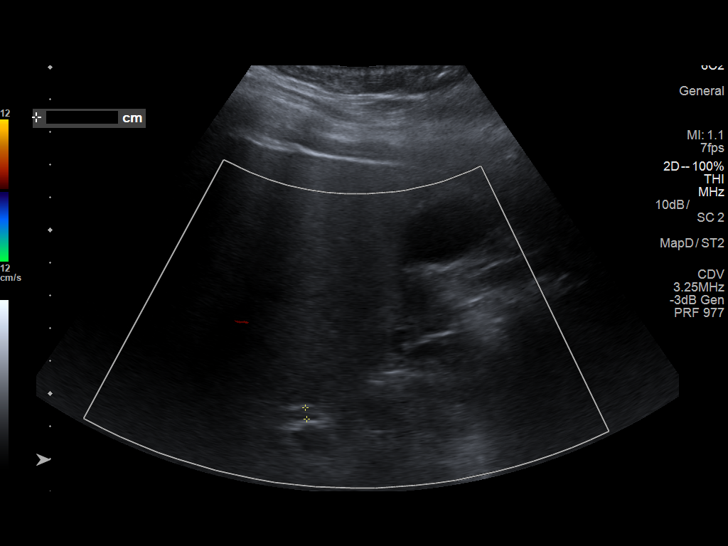
[im 34/63]
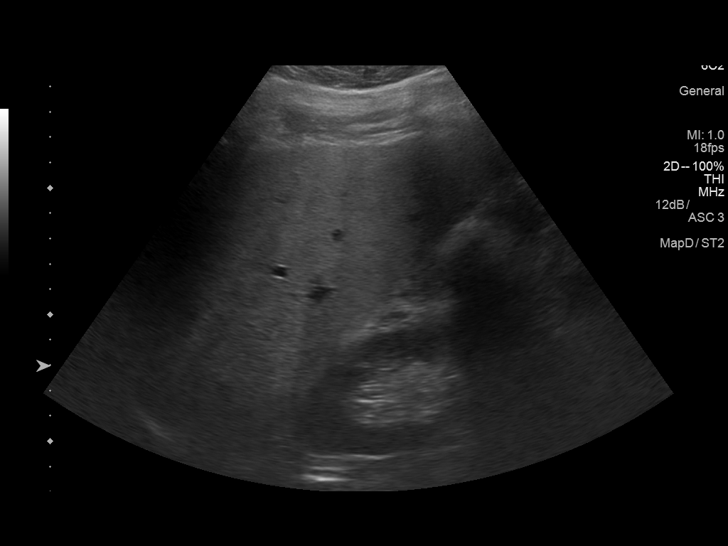
[im 39/63]
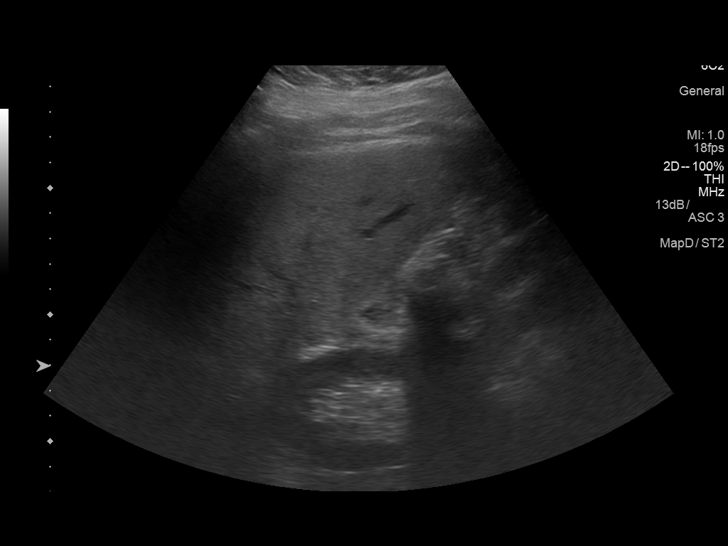
[im 42/63]
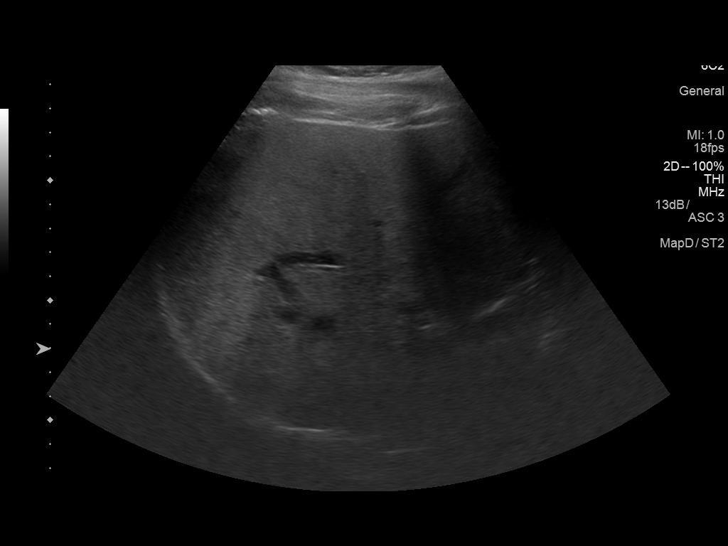
[im 47/63]
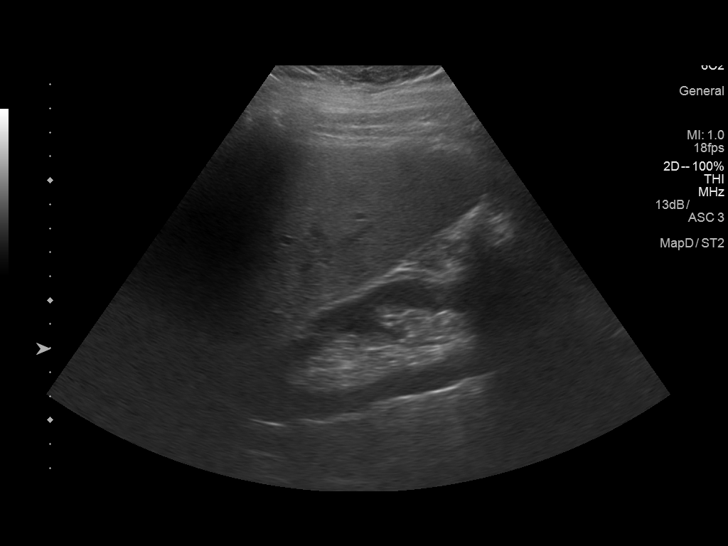
[im 52/63]
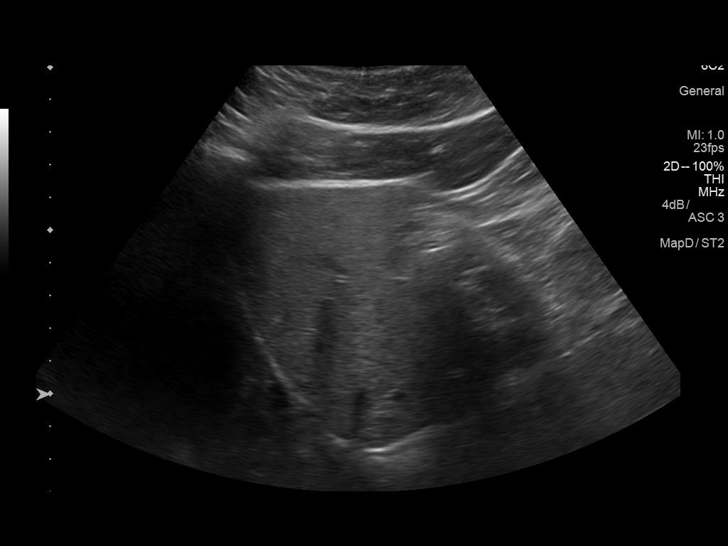
[im 57/63]
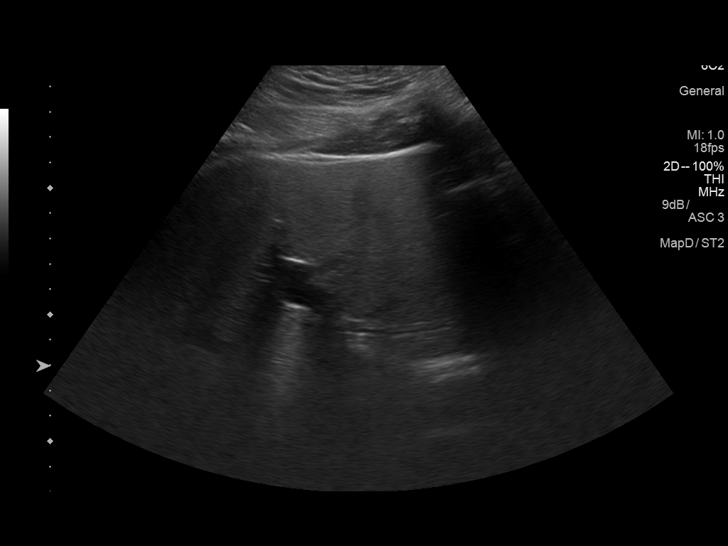
[im 63/63]
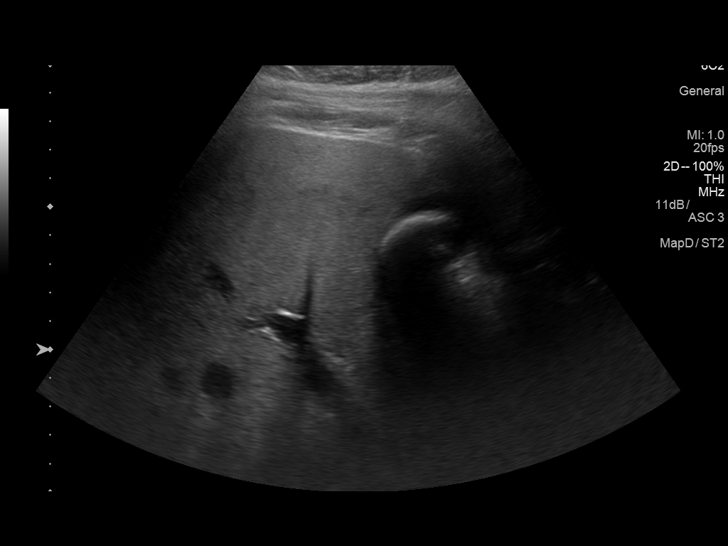

[14 of 25 positions shown; findings below may reference images not displayed]

FINDINGS: Gallbladder:

Large shadowing echogenic stone present within the gallbladder
lumen, measuring 2.4 x 2.5 cm. Gallbladder wall measure within
normal limits at 2 mm. No free pericholecystic fluid. No sonographic
Murphy sign elicited on exam.

Common bile duct:

Diameter: 4.8 mm

Liver:

No focal lesion identified. Within normal limits in parenchymal
echogenicity.
IMPRESSION: 2.5 cm shadowing stone within the gallbladder lumen. No other
sonographic features to suggest acute cholecystitis. No biliary
dilatation.

## 2018-08-18 IMAGING — CT CT ABD-PELV W/ CM
2 of 4 series · 15 of 46 positions shown, 17 images · IV contrast (Isovue)
Comparison: None.

CLINICAL DATA: Upper abdominal pain with nausea and vomiting since
4 a.m. today.

EXAM:
CT ABDOMEN AND PELVIS WITH CONTRAST
TECHNIQUE: Multidetector CT imaging of the abdomen and pelvis was performed
using the standard protocol following bolus administration of
intravenous contrast.
CONTRAST:  100mL SPDF16-NPP IOPAMIDOL (SPDF16-NPP) INJECTION 61%

[Series 2: axial st · axial · 0.77mm/px · z∈[+943,+1433]mm · 12 of 108 slices shown, 14 images]
[im 5/108  soft-tissue]
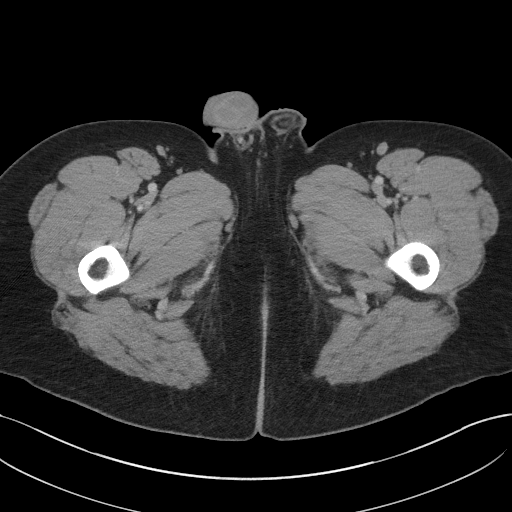
[im 5/108  bone]
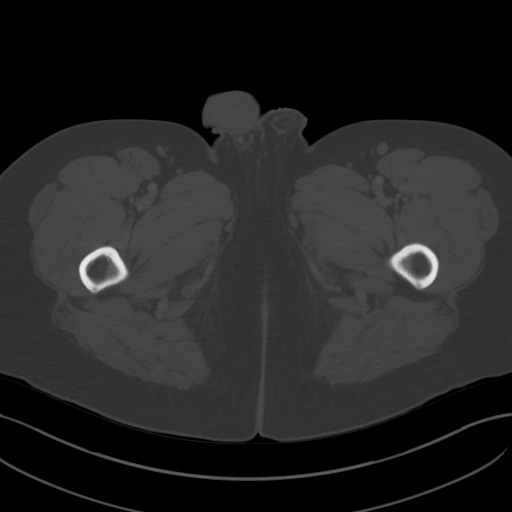
[im 14/108  soft-tissue]
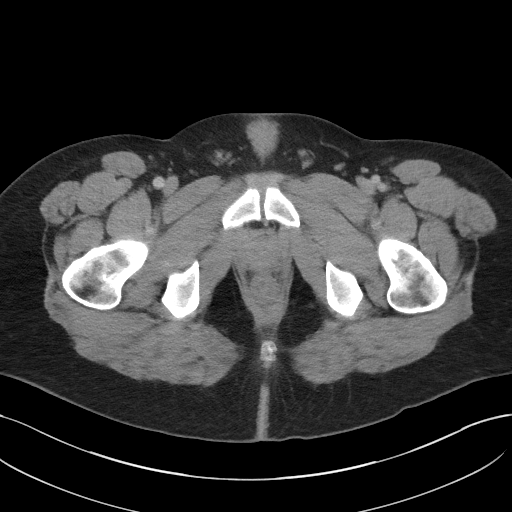
[im 24/108  soft-tissue]
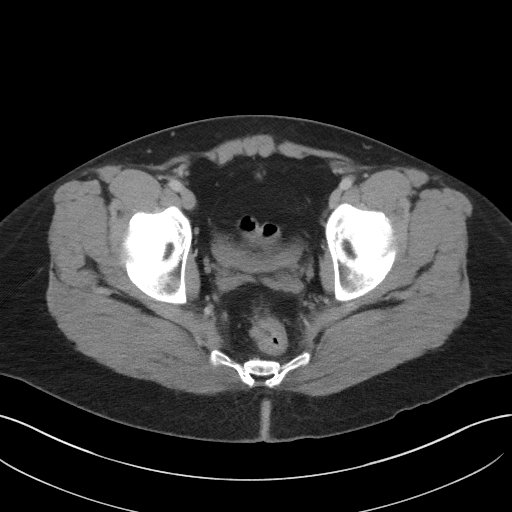
[im 33/108  soft-tissue]
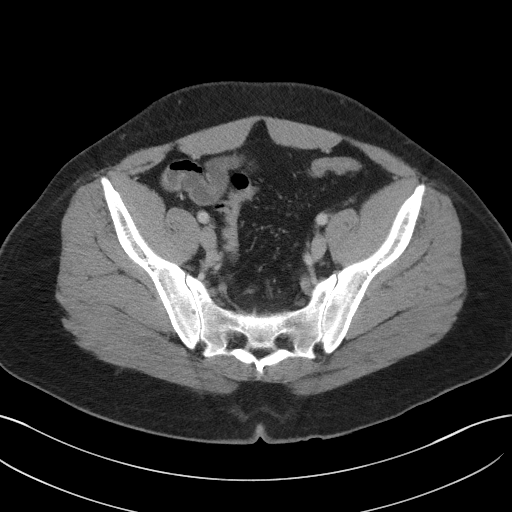
[im 42/108  soft-tissue]
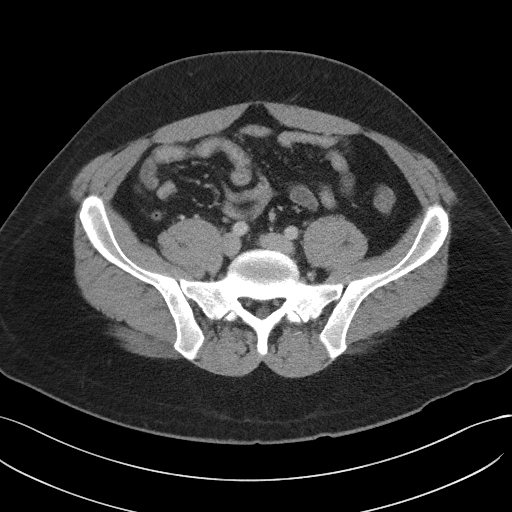
[im 52/108  soft-tissue]
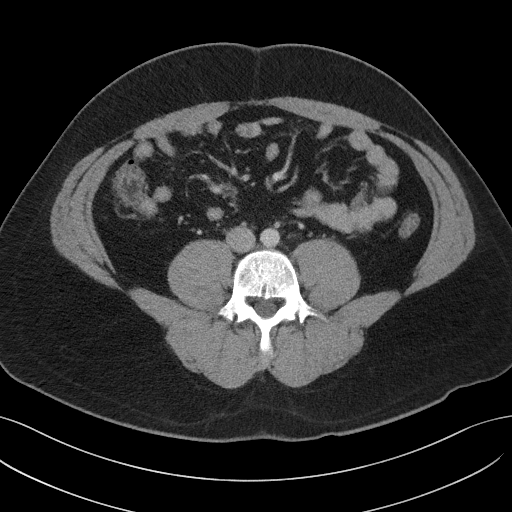
[im 56/108  soft-tissue]
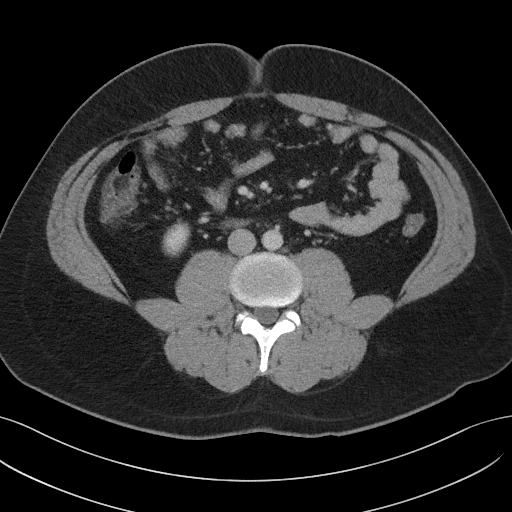
[im 66/108  soft-tissue]
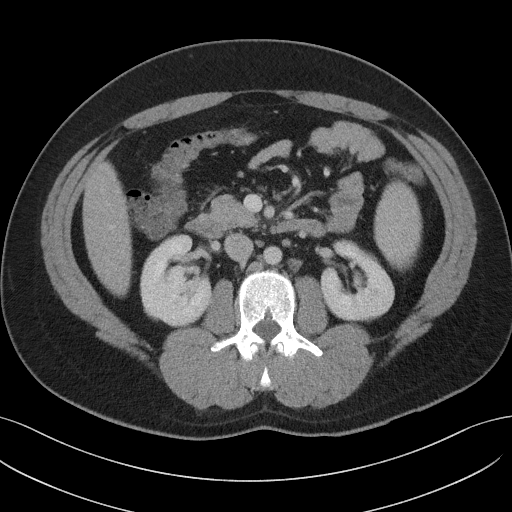
[im 75/108  soft-tissue]
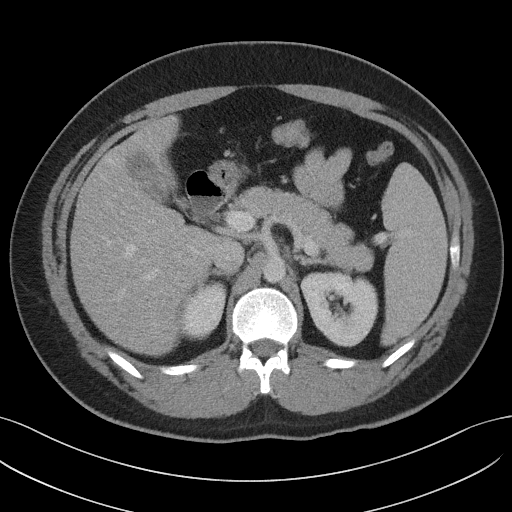
[im 75/108  bone]
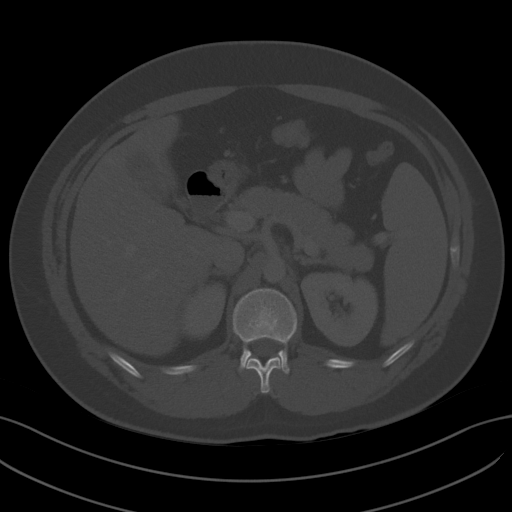
[im 84/108  soft-tissue]
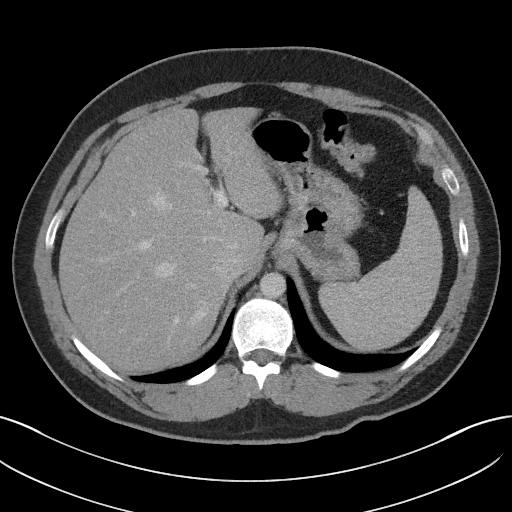
[im 94/108  soft-tissue]
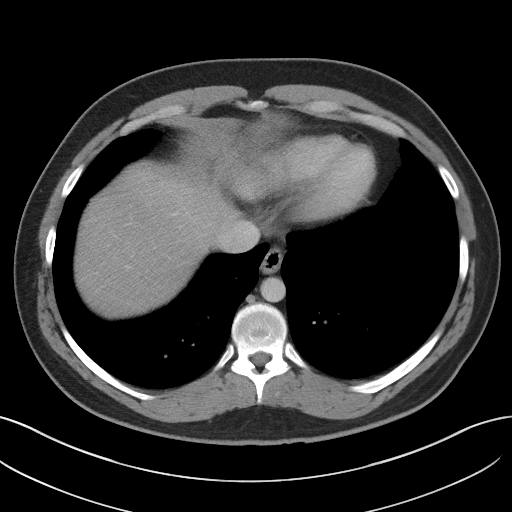
[im 103/108  soft-tissue]
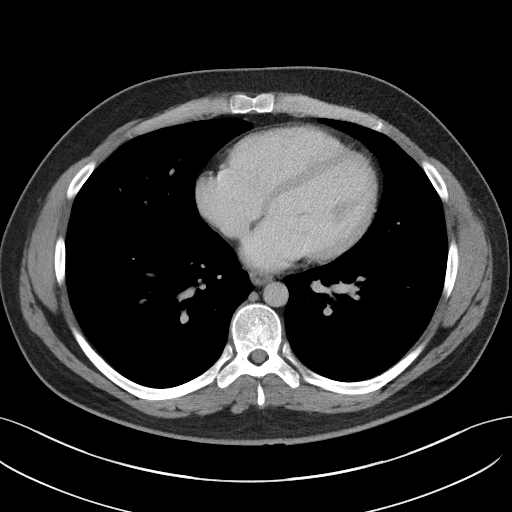

[Series 5: coronal st · coronal · 0.81mm/px · 3 of 99 slices shown]
[im 33/99  soft-tissue]
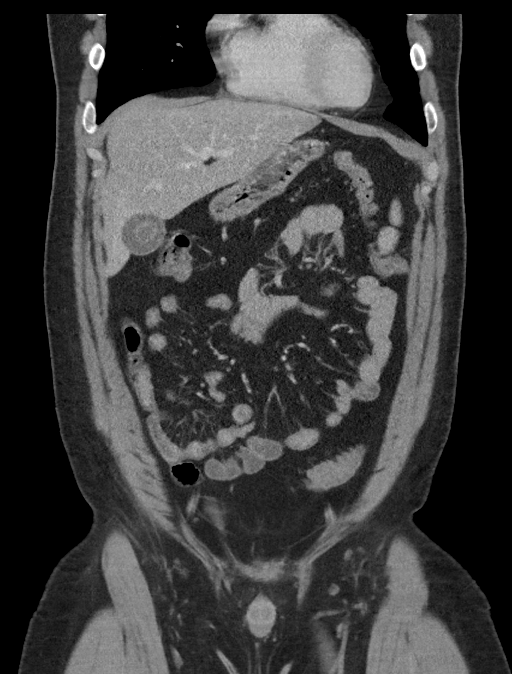
[im 44/99  soft-tissue]
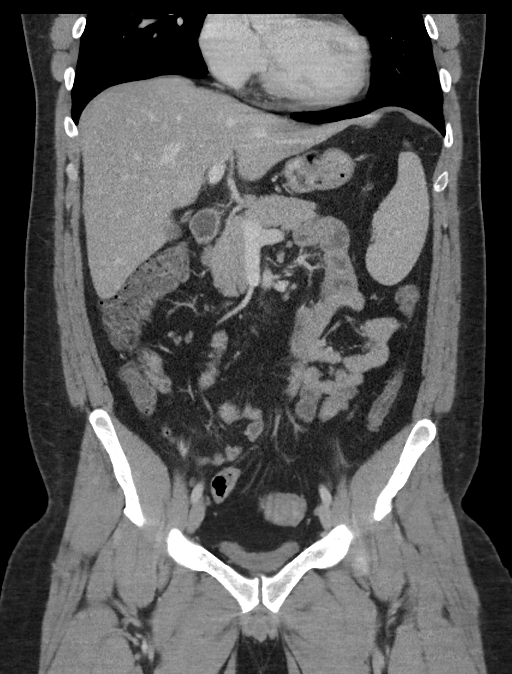
[im 55/99  soft-tissue]
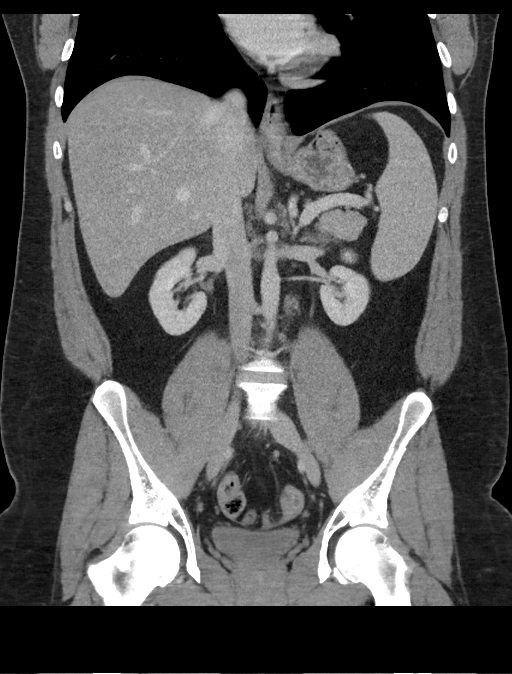

[15 of 46 positions shown; findings below may reference images not displayed]

FINDINGS: Lower chest: No significant pulmonary nodules or acute consolidative
airspace disease.

Hepatobiliary: Diffuse hepatic steatosis. Normal liver size. No
definite liver surface irregularity. No liver mass. Cholelithiasis.
No significant gallbladder distention. No gallbladder wall
thickening or pericholecystic fluid. No biliary ductal dilatation.
Common bile duct diameter 3 mm.

Pancreas: Normal, with no mass or duct dilation.

Spleen: Borderline enlarged spleen (craniocaudal splenic length
cm). No splenic mass.

Adrenals/Urinary Tract: Normal adrenals. Normal kidneys with no
hydronephrosis and no renal mass. Normal bladder.

Stomach/Bowel: Tiny hiatal hernia. Otherwise collapsed and grossly
normal stomach. Normal caliber small bowel with no small bowel wall
thickening. Normal appendix . Normal large bowel with no
diverticulosis, large bowel wall thickening or pericolonic fat
stranding.

Vascular/Lymphatic: Normal caliber abdominal aorta. Patent portal,
splenic, hepatic and renal veins. No pathologically enlarged lymph
nodes in the abdomen or pelvis.

Reproductive: Normal size prostate.

Other: No pneumoperitoneum, ascites or focal fluid collection.

Musculoskeletal: No aggressive appearing focal osseous lesions.
Sclerotic lesions in the bilateral proximal femora, largest in the
right femoral head, nonspecific, probably benign bone islands.
IMPRESSION: 1. Cholelithiasis. No CT evidence of acute cholecystitis. No biliary
ductal dilatation. Consider correlation with right upper quadrant
abdominal sonogram as clinically warranted.
2. No evidence of bowel obstruction or acute bowel inflammation.
Normal appendix.
3. Tiny hiatal hernia.
4. Borderline enlarged spleen. If the patient is above average in
height, this may represent a normal spleen size. No abdominopelvic
lymphadenopathy.
5. Diffuse hepatic steatosis.

## 2019-08-21 ENCOUNTER — Other Ambulatory Visit: Payer: Self-pay

## 2019-08-21 DIAGNOSIS — Z20822 Contact with and (suspected) exposure to covid-19: Secondary | ICD-10-CM

## 2019-08-23 LAB — NOVEL CORONAVIRUS, NAA: SARS-CoV-2, NAA: DETECTED — AB

## 2020-01-22 ENCOUNTER — Encounter (HOSPITAL_COMMUNITY): Payer: Self-pay

## 2020-01-22 ENCOUNTER — Emergency Department (HOSPITAL_COMMUNITY)
Admission: EM | Admit: 2020-01-22 | Discharge: 2020-01-22 | Disposition: A | Discharge status: Home / Self Care | Payer: BC Managed Care – PPO | Attending: Emergency Medicine | Admitting: Emergency Medicine

## 2020-01-22 ENCOUNTER — Other Ambulatory Visit: Payer: Self-pay

## 2020-01-22 DIAGNOSIS — Z8616 Personal history of COVID-19: Secondary | ICD-10-CM | POA: Diagnosis not present

## 2020-01-22 DIAGNOSIS — Z79899 Other long term (current) drug therapy: Secondary | ICD-10-CM | POA: Insufficient documentation

## 2020-01-22 DIAGNOSIS — F17210 Nicotine dependence, cigarettes, uncomplicated: Secondary | ICD-10-CM | POA: Insufficient documentation

## 2020-01-22 DIAGNOSIS — J069 Acute upper respiratory infection, unspecified: Secondary | ICD-10-CM | POA: Diagnosis not present

## 2020-01-22 DIAGNOSIS — R0981 Nasal congestion: Secondary | ICD-10-CM | POA: Diagnosis present

## 2020-01-22 MED ORDER — BENZONATATE 100 MG PO CAPS: 100.0000 mg | ORAL_CAPSULE | Freq: Three times a day (TID) | ORAL | 0 refills | Status: AC

## 2020-01-22 MED ORDER — ALBUTEROL SULFATE HFA 108 (90 BASE) MCG/ACT IN AERS
2.0000 | INHALATION_SPRAY | Freq: Once | RESPIRATORY_TRACT | Status: AC
  Administered 2020-01-22: 2 via RESPIRATORY_TRACT
  Filled 2020-01-22: qty 6.7

## 2020-01-22 MED ORDER — FLUTICASONE PROPIONATE 50 MCG/ACT NA SUSP: 2.0000 | Freq: Every day | NASAL | 0 refills | Status: AC

## 2020-01-22 NOTE — ED Triage Notes (Signed)
Pt to er, pt states that he has had a cold since Sunday, states that he also has a cough, states that he was hoping to get a doctors note because his work asked him for one.

## 2020-01-22 NOTE — ED Triage Notes (Signed)
States that he had covid back in October, and this feels different

## 2020-01-22 NOTE — Discharge Instructions (Signed)
Take 2 puffs of the albuterol inhaler every 4-6 hours as needed for wheezing and cough.  You are also given a prescription for cough medication and Flonase.  Please use as directed.  Please follow up with your primary care provider within 5-7 days for re-evaluation of your symptoms. If you do not have a primary care provider, information for a healthcare clinic has been provided for you to make arrangements for follow up care. Please return to the emergency department for any new or worsening symptoms.

## 2020-01-22 NOTE — ED Provider Notes (Signed)
[facility] EMERGENCY DEPARTMENT Provider Note   CSN: [id number] Arrival date & time: 01/22/20  1904     History Chief Complaint  Patient presents with  . Nasal Congestion    Thomas Potts is a 29 y.o. male.  HPI    Pt is a 29 y/o male who presents to the ED today fore val of nasal congestion, rhinorrhea, cough and chills that started a few days ago. States cough is dry.  He denies fevers, body aches, NVD. He had a headache a few days ago but this has resolved. His daughter had sxs last week that were similar and her sxs have resolved.   Pt states he was diagnosed with COVID 07/2019. Denies that sxs feel similar at this time.   History reviewed. No pertinent past medical history.  There are no problems to display for this patient.   History reviewed. No pertinent surgical history.     History reviewed. No pertinent family history.  Social History   Tobacco Use  . Smoking status: Current Every Day Smoker    Types: Cigarettes  . Smokeless tobacco: Never Used  Substance Use Topics  . Alcohol use: No  . Drug use: No    Home Medications Prior to Admission medications   Medication Sig Start Date End Date Taking? Authorizing Provider  ibuprofen (ADVIL) 200 MG tablet Take 800 mg by mouth daily as needed for mild pain or moderate pain.   Yes [provider]  benzonatate (TESSALON) 100 MG capsule Take 1 capsule (100 mg total) by mouth every 8 (eight) hours for 5 days. 01/22/20 01/27/20  Spirit Wernli S, PA-C  fluticasone (FLONASE) 50 MCG/ACT nasal spray Place 2 sprays into both nostrils daily. 01/22/20   Ensley Blas S, PA-C    Allergies    Amoxicillin and Penicillins  Review of Systems   Review of Systems  Constitutional: Negative for chills and fever.  HENT: Positive for congestion and rhinorrhea. Negative for ear pain and sore throat.   Eyes: Negative for pain and visual disturbance.  Respiratory: Positive for cough. Negative for shortness of  breath.   Cardiovascular: Negative for chest pain.  Gastrointestinal: Negative for abdominal pain, constipation, diarrhea, nausea and vomiting.  Genitourinary: Negative for dysuria and hematuria.  Musculoskeletal: Negative for myalgias.  Skin: Negative for rash.  Neurological: Positive for headaches (resolved).  All other systems reviewed and are negative.   Physical Exam Updated Vital Signs BP 129/76 (BP Location: Right Arm)   Pulse 90   Temp 99 F (37.2 C) (Oral)   Resp 18   Ht 5\' 11"  (1.803 m)   Wt 115.7 kg   SpO2 100%   BMI 35.57 kg/m   Physical Exam Vitals and nursing note reviewed.  Constitutional:      General: He is not in acute distress.    Appearance: He is well-developed. He is not ill-appearing or toxic-appearing.  HENT:     Head: Normocephalic and atraumatic.     Nose: Congestion present.     Mouth/Throat:     Mouth: Mucous membranes are moist.     Pharynx: No oropharyngeal exudate or posterior oropharyngeal erythema.  Eyes:     Conjunctiva/sclera: Conjunctivae normal.  Cardiovascular:     Rate and Rhythm: Normal rate and regular rhythm.     Pulses: Normal pulses.     Heart sounds: Normal heart sounds. No murmur.  Pulmonary:     Effort: Pulmonary effort is normal. No respiratory distress.     Breath  sounds: Wheezing present. No rhonchi or rales.     Comments: Dry cough Abdominal:     General: Bowel sounds are normal. There is no distension.     Palpations: Abdomen is soft.     Tenderness: There is no abdominal tenderness. There is no guarding or rebound.  Musculoskeletal:     Cervical back: Neck supple.  Skin:    General: Skin is warm and dry.  Neurological:     Mental Status: He is alert.     ED Results / Procedures / Treatments   Labs (all labs ordered are listed, but only abnormal results are displayed) Labs Reviewed - No data to display  EKG None  Radiology No results found.  Procedures Procedures (including critical care  time)  Medications Ordered in ED Medications  albuterol (VENTOLIN HFA) 108 (90 Base) MCG/ACT inhaler 2 puff (has no administration in time range)    ED Course  I have reviewed the triage vital signs and the nursing notes.  Pertinent labs & imaging results that were available during my care of the patient were reviewed by me and considered in my medical decision making (see chart for details).    MDM Rules/Calculators/A&P                       Patients symptoms are consistent with URI, likely viral etiology.  Discussed likely diagnosis of viral URI.  Less likely Covid as patient has already had this infection.  Pt will be discharged with symptomatic treatment.  Verbalizes understanding and is agreeable with plan. Pt is hemodynamically stable & in NAD prior to dc.  Final Clinical Impression(s) / ED Diagnoses Final diagnoses:  Upper respiratory tract infection, unspecified type    Rx / DC Orders ED Discharge Orders         Ordered    benzonatate (TESSALON) 100 MG capsule  Every 8 hours     01/22/20 2047    fluticasone (FLONASE) 50 MCG/ACT nasal spray  Daily     01/22/20 2047           Rodney Booze, PA-C 01/22/20 2047    Lajean Saver, MD 01/23/20 1640

## 2022-11-13 ENCOUNTER — Encounter (HOSPITAL_COMMUNITY): Payer: Self-pay

## 2022-11-13 ENCOUNTER — Emergency Department (HOSPITAL_COMMUNITY)
Admission: EM | Admit: 2022-11-13 | Discharge: 2022-11-13 | Disposition: A | Discharge status: Home / Self Care | Payer: Medicaid Other | Attending: Emergency Medicine | Admitting: Emergency Medicine

## 2022-11-13 ENCOUNTER — Emergency Department (HOSPITAL_COMMUNITY): Payer: Medicaid Other

## 2022-11-13 ENCOUNTER — Other Ambulatory Visit: Payer: Self-pay

## 2022-11-13 DIAGNOSIS — R059 Cough, unspecified: Secondary | ICD-10-CM | POA: Diagnosis present

## 2022-11-13 DIAGNOSIS — U071 COVID-19: Secondary | ICD-10-CM | POA: Diagnosis not present

## 2022-11-13 LAB — BASIC METABOLIC PANEL
Anion gap: 8 (ref 5–15)
BUN: 10 mg/dL (ref 6–20)
CO2: 23 mmol/L (ref 22–32)
Calcium: 8.7 mg/dL — ABNORMAL LOW (ref 8.9–10.3)
Chloride: 103 mmol/L (ref 98–111)
Creatinine, Ser: 0.88 mg/dL (ref 0.61–1.24)
GFR, Estimated: >60 mL/min (ref >60)
Glucose, Bld: 97 mg/dL (ref 70–99)
Potassium: 4.1 mmol/L (ref 3.5–5.1)
Sodium: 134 mmol/L — ABNORMAL LOW (ref 135–145)

## 2022-11-13 LAB — RESP PANEL BY RT-PCR (RSV, FLU A&B, COVID)  RVPGX2
Influenza A by PCR: NEGATIVE
Influenza B by PCR: NEGATIVE
Resp Syncytial Virus by PCR: NEGATIVE
SARS Coronavirus 2 by RT PCR: POSITIVE — AB

## 2022-11-13 MED ORDER — ACETAMINOPHEN 500 MG PO TABS
1000.0000 mg | ORAL_TABLET | Freq: Once | ORAL | Status: AC
  Administered 2022-11-13: 1000 mg via ORAL
  Filled 2022-11-13: qty 2

## 2022-11-13 MED ORDER — NIRMATRELVIR/RITONAVIR (PAXLOVID)TABLET: 3.0000 | ORAL_TABLET | Freq: Two times a day (BID) | ORAL | 0 refills | Status: DC

## 2022-11-13 MED ORDER — NIRMATRELVIR/RITONAVIR (PAXLOVID)TABLET: 3.0000 | ORAL_TABLET | Freq: Two times a day (BID) | ORAL | 0 refills | Status: AC

## 2022-11-13 NOTE — ED Notes (Signed)
Pt taken to xray 

## 2022-11-13 NOTE — ED Provider Notes (Signed)
[facility] Provider Note   CSN: [id number] Arrival date & time: 11/13/22  1433     History  Chief Complaint  Patient presents with   Cough    Thomas Potts is a 32 y.o. male with no documented medical history.  Patient presents to ED for evaluation of URI symptoms.  Patient reports that beginning yesterday he developed cough, runny nose, congestion, sore throat, body aches and chills.  Patient denies fevers, nausea or vomiting, trouble swallowing, diarrhea, SOB.  Patient reports that his father is currently experiencing similar symptoms however his father has not been tested.  Patient denies any OTC medications prior to arrival.   Cough Associated symptoms: chills, myalgias, rhinorrhea and sore throat   Associated symptoms: no chest pain, no fever and no shortness of breath        Home Medications Prior to Admission medications   Medication Sig Start Date End Date Taking? Authorizing Provider  nirmatrelvir/ritonavir (PAXLOVID) 20 x 150 MG & 10 x 100MG  TABS Take 3 tablets by mouth 2 (two) times daily for 5 days. Patient GFR is greater than 60. Take nirmatrelvir (150 mg) two tablets twice daily for 5 days and ritonavir (100 mg) one tablet twice daily for 5 days. 11/13/22 11/18/22 Yes Azucena Cecil, PA-C  fluticasone (FLONASE) 50 MCG/ACT nasal spray Place 2 sprays into both nostrils daily. 01/22/20   Couture, Cortni S, PA-C  ibuprofen (ADVIL) 200 MG tablet Take 800 mg by mouth daily as needed for mild pain or moderate pain.    [provider]      Allergies    Amoxicillin and Penicillins    Review of Systems   Review of Systems  Constitutional:  Positive for chills. Negative for fever.  HENT:  Positive for congestion, rhinorrhea and sore throat. Negative for trouble swallowing.   Respiratory:  Positive for cough. Negative for shortness of breath.   Cardiovascular:  Negative for chest pain.  Gastrointestinal:   Negative for diarrhea, nausea and vomiting.  Musculoskeletal:  Positive for myalgias.  All other systems reviewed and are negative.   Physical Exam Updated Vital Signs BP 126/80   Pulse 100   Temp 98.8 F (37.1 C) (Oral)   Resp 19   Ht 5\' 10"  (1.778 m)   Wt 112.5 kg   SpO2 100%   BMI 35.58 kg/m  Physical Exam Vitals and nursing note reviewed.  Constitutional:      General: He is not in acute distress.    Appearance: Normal appearance. He is not ill-appearing, toxic-appearing or diaphoretic.  HENT:     Head: Normocephalic and atraumatic.     Nose: Nose normal. No congestion.     Mouth/Throat:     Mouth: Mucous membranes are moist.     Pharynx: Oropharynx is clear. Posterior oropharyngeal erythema present. No oropharyngeal exudate.  Eyes:     Extraocular Movements: Extraocular movements intact.     Conjunctiva/sclera: Conjunctivae normal.     Pupils: Pupils are equal, round, and reactive to light.  Cardiovascular:     Rate and Rhythm: Normal rate and regular rhythm.  Pulmonary:     Effort: Pulmonary effort is normal.     Breath sounds: Normal breath sounds. No wheezing.  Abdominal:     General: Abdomen is flat. Bowel sounds are normal.     Palpations: Abdomen is soft.     Tenderness: There is no abdominal tenderness.  Musculoskeletal:     Cervical back: Normal  range of motion and neck supple. No tenderness.  Skin:    General: Skin is warm and dry.     Capillary Refill: Capillary refill takes less than 2 seconds.  Neurological:     Mental Status: He is alert and oriented to person, place, and time.     ED Results / Procedures / Treatments   Labs (all labs ordered are listed, but only abnormal results are displayed) Labs Reviewed  RESP PANEL BY RT-PCR (RSV, FLU A&B, COVID)  [id number] - Abnormal; Notable for the following components:      Result Value   SARS Coronavirus 2 by RT PCR POSITIVE (*)    All other components within normal limits  BASIC METABOLIC PANEL -  Abnormal; Notable for the following components:   Sodium 134 (*)    Calcium 8.7 (*)    All other components within normal limits    EKG None  Radiology DG Chest 2 View  Result Date: 11/13/2022 CLINICAL DATA:  Productive cough and rhinorrhea for 2 days. Body aches. EXAM: CHEST - 2 VIEW COMPARISON:  05/28/2013 FINDINGS: The heart size and mediastinal contours are within normal limits. Bilateral central peribronchial thickening is increased since previous study. No evidence of pulmonary airspace disease or pleural effusion. The visualized skeletal structures are unremarkable. IMPRESSION: Bilateral central peribronchial thickening. No evidence of pneumonia. Electronically Signed   By: Marlaine Hind M.D.   On: 11/13/2022 15:24    Procedures Procedures    Medications Ordered in ED Medications  acetaminophen (TYLENOL) tablet 1,000 mg (1,000 mg Oral Given 11/13/22 1459)    ED Course/ Medical Decision Making/ A&P                          Medical Decision Making Amount and/or Complexity of Data Reviewed Labs: ordered. Radiology: ordered.  Risk OTC drugs.   32 year old male presents to the ED for evaluation of URI symptoms.  Please see HPI for further details.  Viral testing positive for COVID.  Patient chest x-ray clear, no signs of consolidations or effusions.  Patient metabolic panel shows stable creatinine so we will send the patient home with Paxlovid.  Patient advised to treat symptoms conservatively at home.  Patient advised to quarantine.  Patient given return precautions and he voiced understanding.  Patient discharged in stable condition.  Final Clinical Impression(s) / ED Diagnoses Final diagnoses:  [id number]    Rx / DC Orders ED Discharge Orders          Ordered    nirmatrelvir/ritonavir (PAXLOVID) 20 x 150 MG & 10 x 100MG  TABS  2 times daily        11/13/22 1636              Azucena Cecil, PA-C 11/13/22 1636    Noemi Chapel, MD 11/24/22 1450

## 2022-11-13 NOTE — Discharge Instructions (Signed)
Return to the ED with any new or worsening signs or symptoms Please read attached guidance concerning COVID-19 and quarantine and isolation Please see work note Please pick up prescription for Paxlovid that I have sent in Please treat symptoms conservatively at home.  Please take Tylenol for headaches and fevers.  Please take ibuprofen for body aches and chills.  Please take Zicam to help shorten duration of symptoms.  You may also purchase Pedialyte or Gatorade to help with hydration.

## 2022-11-13 NOTE — ED Triage Notes (Signed)
Pt c/o cough . Body aches, mucous, sneezing , runny nose, and weakness x 2 days.

## 2022-11-13 NOTE — ED Notes (Signed)
See triage notes. Nad. No gen weakness noted

## 2023-10-23 ENCOUNTER — Other Ambulatory Visit: Payer: Self-pay

## 2023-10-23 ENCOUNTER — Emergency Department (HOSPITAL_COMMUNITY)
Admission: EM | Admit: 2023-10-23 | Discharge: 2023-10-24 | Discharge status: Against Medical Advice | Payer: Medicaid Other | Attending: Emergency Medicine | Admitting: Emergency Medicine

## 2023-10-23 ENCOUNTER — Emergency Department (HOSPITAL_COMMUNITY): Payer: Medicaid Other

## 2023-10-23 DIAGNOSIS — R1110 Vomiting, unspecified: Secondary | ICD-10-CM | POA: Insufficient documentation

## 2023-10-23 DIAGNOSIS — R509 Fever, unspecified: Secondary | ICD-10-CM | POA: Insufficient documentation

## 2023-10-23 DIAGNOSIS — R059 Cough, unspecified: Secondary | ICD-10-CM | POA: Insufficient documentation

## 2023-10-23 DIAGNOSIS — Z20822 Contact with and (suspected) exposure to covid-19: Secondary | ICD-10-CM | POA: Insufficient documentation

## 2023-10-23 DIAGNOSIS — Z5321 Procedure and treatment not carried out due to patient leaving prior to being seen by health care provider: Secondary | ICD-10-CM | POA: Diagnosis not present

## 2023-10-23 DIAGNOSIS — M791 Myalgia, unspecified site: Secondary | ICD-10-CM | POA: Insufficient documentation

## 2023-10-23 LAB — RESP PANEL BY RT-PCR (RSV, FLU A&B, COVID)  RVPGX2
Influenza A by PCR: NEGATIVE
Influenza B by PCR: NEGATIVE
Resp Syncytial Virus by PCR: NEGATIVE
SARS Coronavirus 2 by RT PCR: NEGATIVE

## 2023-10-23 NOTE — ED Triage Notes (Signed)
 Patient from home for cold like symptoms. States he thinks he has pneumonia. Reports productive cough, body aches, fevers, chills, and vomiting that started around Christmas and has lingered. Upon arrival to ER, patient is alert and oriented, ambu. Airway patent and intact, in no apparent distress

## 2024-02-26 ENCOUNTER — Encounter (HOSPITAL_COMMUNITY): Payer: Self-pay

## 2024-02-26 ENCOUNTER — Other Ambulatory Visit: Payer: Self-pay

## 2024-02-26 ENCOUNTER — Emergency Department (HOSPITAL_COMMUNITY)
Admission: EM | Admit: 2024-02-26 | Discharge: 2024-02-27 | Discharge status: Against Medical Advice | Attending: Emergency Medicine | Admitting: Emergency Medicine

## 2024-02-26 DIAGNOSIS — Z5321 Procedure and treatment not carried out due to patient leaving prior to being seen by health care provider: Secondary | ICD-10-CM | POA: Insufficient documentation

## 2024-02-26 DIAGNOSIS — H00026 Hordeolum internum left eye, unspecified eyelid: Secondary | ICD-10-CM | POA: Insufficient documentation

## 2024-02-26 NOTE — ED Triage Notes (Signed)
 Pt states he thinks he has a stye on left eye and now is not able to open it all the way and it is sticky. Pt thinks it is infected. Pt is red and drainage present.

## 2024-02-27 NOTE — ED Notes (Signed)
 Called, no answer.
